# Patient Record
Sex: Female | Born: 1970 | Race: White | Hispanic: No | Marital: Married | Smoking: Current every day smoker
Health system: Southern US, Community
[De-identification: ages and names within clinical notes are randomized; demographics above are authoritative.]

## PROBLEM LIST (undated history)

## (undated) DIAGNOSIS — D0511 Intraductal carcinoma in situ of right breast: Secondary | ICD-10-CM

## (undated) DIAGNOSIS — D649 Anemia, unspecified: Secondary | ICD-10-CM

## (undated) DIAGNOSIS — I10 Essential (primary) hypertension: Secondary | ICD-10-CM

## (undated) DIAGNOSIS — G4733 Obstructive sleep apnea (adult) (pediatric): Secondary | ICD-10-CM

## (undated) DIAGNOSIS — K219 Gastro-esophageal reflux disease without esophagitis: Secondary | ICD-10-CM

## (undated) DIAGNOSIS — Z923 Personal history of irradiation: Secondary | ICD-10-CM

## (undated) DIAGNOSIS — Z8489 Family history of other specified conditions: Secondary | ICD-10-CM

## (undated) DIAGNOSIS — F40240 Claustrophobia: Secondary | ICD-10-CM

## (undated) DIAGNOSIS — G43909 Migraine, unspecified, not intractable, without status migrainosus: Secondary | ICD-10-CM

## (undated) DIAGNOSIS — M1990 Unspecified osteoarthritis, unspecified site: Secondary | ICD-10-CM

## (undated) DIAGNOSIS — C50919 Malignant neoplasm of unspecified site of unspecified female breast: Secondary | ICD-10-CM

## (undated) DIAGNOSIS — D7589 Other specified diseases of blood and blood-forming organs: Secondary | ICD-10-CM

## (undated) DIAGNOSIS — R42 Dizziness and giddiness: Secondary | ICD-10-CM

## (undated) DIAGNOSIS — F419 Anxiety disorder, unspecified: Secondary | ICD-10-CM

## (undated) DIAGNOSIS — R030 Elevated blood-pressure reading, without diagnosis of hypertension: Secondary | ICD-10-CM

## (undated) HISTORY — DX: Other specified diseases of blood and blood-forming organs: D75.89

## (undated) HISTORY — PX: WISDOM TOOTH EXTRACTION: SHX21

## (undated) HISTORY — DX: Malignant neoplasm of unspecified site of unspecified female breast: C50.919

---

## 1991-02-02 HISTORY — PX: TUBAL LIGATION: SHX77

## 2008-11-26 ENCOUNTER — Emergency Department: Payer: Self-pay | Admitting: Emergency Medicine

## 2015-10-24 ENCOUNTER — Emergency Department
Admission: EM | Admit: 2015-10-24 | Discharge: 2015-10-24 | Disposition: A | Discharge status: Home / Self Care | Payer: Managed Care, Other (non HMO) | Attending: Emergency Medicine | Admitting: Emergency Medicine

## 2015-10-24 ENCOUNTER — Encounter: Payer: Self-pay | Admitting: Emergency Medicine

## 2015-10-24 ENCOUNTER — Emergency Department: Payer: Managed Care, Other (non HMO)

## 2015-10-24 DIAGNOSIS — G43909 Migraine, unspecified, not intractable, without status migrainosus: Secondary | ICD-10-CM | POA: Diagnosis not present

## 2015-10-24 DIAGNOSIS — F17210 Nicotine dependence, cigarettes, uncomplicated: Secondary | ICD-10-CM | POA: Insufficient documentation

## 2015-10-24 DIAGNOSIS — R51 Headache: Secondary | ICD-10-CM | POA: Diagnosis present

## 2015-10-24 DIAGNOSIS — G43009 Migraine without aura, not intractable, without status migrainosus: Secondary | ICD-10-CM

## 2015-10-24 HISTORY — DX: Migraine, unspecified, not intractable, without status migrainosus: G43.909

## 2015-10-24 LAB — CBC WITH DIFFERENTIAL/PLATELET
Basophils Absolute: 0.1 10*3/uL (ref 0–0.1)
Basophils Relative: 1 %
EOS ABS: 0.2 10*3/uL (ref 0–0.7)
EOS PCT: 2 %
HCT: 45.2 % (ref 35.0–47.0)
HEMOGLOBIN: 16 g/dL (ref 12.0–16.0)
LYMPHS ABS: 2 10*3/uL (ref 1.0–3.6)
LYMPHS PCT: 22 %
MCH: 35.1 pg — AB (ref 26.0–34.0)
MCHC: 35.5 g/dL (ref 32.0–36.0)
MCV: 99 fL (ref 80.0–100.0)
MONOS PCT: 4 %
Monocytes Absolute: 0.4 10*3/uL (ref 0.2–0.9)
Neutro Abs: 6.6 10*3/uL — ABNORMAL HIGH (ref 1.4–6.5)
Neutrophils Relative %: 71 %
PLATELETS: 294 10*3/uL (ref 150–440)
RBC: 4.57 MIL/uL (ref 3.80–5.20)
RDW: 12.7 % (ref 11.5–14.5)
WBC: 9.3 10*3/uL (ref 3.6–11.0)

## 2015-10-24 LAB — BASIC METABOLIC PANEL
Anion gap: 9 (ref 5–15)
BUN: 10 mg/dL (ref 6–20)
CHLORIDE: 105 mmol/L (ref 101–111)
CO2: 22 mmol/L (ref 22–32)
CREATININE: 0.81 mg/dL (ref 0.44–1.00)
Calcium: 9.6 mg/dL (ref 8.9–10.3)
GFR calc Af Amer: >60 mL/min (ref >60)
GFR calc non Af Amer: >60 mL/min (ref >60)
GLUCOSE: 132 mg/dL — AB (ref 65–99)
POTASSIUM: 3.7 mmol/L (ref 3.5–5.1)
Sodium: 136 mmol/L (ref 135–145)

## 2015-10-24 MED ORDER — SODIUM CHLORIDE 0.9 % IV BOLUS (SEPSIS)
1000.0000 mL | Freq: Once | INTRAVENOUS | Status: AC
  Administered 2015-10-24: 1000 mL via INTRAVENOUS

## 2015-10-24 MED ORDER — KETOROLAC TROMETHAMINE 30 MG/ML IJ SOLN
30.0000 mg | Freq: Once | INTRAMUSCULAR | Status: AC
  Administered 2015-10-24: 30 mg via INTRAVENOUS
  Filled 2015-10-24: qty 1

## 2015-10-24 MED ORDER — METOCLOPRAMIDE HCL 5 MG/ML IJ SOLN
10.0000 mg | Freq: Once | INTRAMUSCULAR | Status: AC
  Administered 2015-10-24: 10 mg via INTRAVENOUS
  Filled 2015-10-24: qty 2

## 2015-10-24 MED ORDER — DIPHENHYDRAMINE HCL 50 MG/ML IJ SOLN
12.5000 mg | Freq: Once | INTRAMUSCULAR | Status: AC
  Administered 2015-10-24: 12.5 mg via INTRAVENOUS
  Filled 2015-10-24: qty 1

## 2015-10-24 NOTE — ED Triage Notes (Signed)
Pt in via triage with complaints of waking up with a headache this morning, taking 500mg  ibuprofen with out any relief.  Headache has since become worse, causing light sensitivity and blurred vision to right eye.  Pt with hx of migraines.  Pt A/Ox4, vitals WDL, no immediate distress at this time.

## 2015-10-24 NOTE — ED Notes (Signed)
Pt states that she woke up at 0700 normal, states her head started hurting at 0900, states that she gets headaches all the time but this one wouldn't go away, no deficits noted, cont to monitor

## 2015-10-24 NOTE — ED Provider Notes (Signed)
[facility] Provider Note   CSN: [id number] Arrival date & time: 10/24/15  1210     History   Chief Complaint Chief Complaint  Patient presents with  . Headache    HPI Wendy Russell is a 45 y.o. female history of migraines here presenting with Headaches. She states that she has a history of migraines but this is worse than usual. She woke up feeling okay and shortly afterwards she developed right sided headaches which is typical of her migraines. She took 200 mg of Motrin but it did not help her. Associated with some photophobia and phonophobia. She had an episode of vomiting as well. Denies any fevers or neck pain or stiffness. Denies any history of aneurysms.     The history is provided by the patient.    Past Medical History:  Diagnosis Date  . Migraines     There are no active problems to display for this patient.   Past Surgical History:  Procedure Laterality Date  . CESAREAN SECTION    . TUBAL LIGATION  1993    OB History    No data available       Home Medications    Prior to Admission medications   Not on File    Family History No family history on file.  Social History Social History  Substance Use Topics  . Smoking status: Current Every Day Smoker    Packs/day: 0.50    Types: Cigarettes  . Smokeless tobacco: Never Used  . Alcohol use Yes     Comment: occassional     Allergies   Codeine and Aspirin   Review of Systems Review of Systems  Neurological: Positive for headaches.  All other systems reviewed and are negative.    Physical Exam Updated Vital Signs BP (!) 143/66 (BP Location: Right Arm)   Pulse 78   Temp 98 F (36.7 C) (Oral)   Resp 16   Ht 4\' 11"  (1.499 m)   Wt 148 lb (67.1 kg)   LMP 10/05/2015 (Exact Date)   SpO2 98%   BMI 29.89 kg/m   Physical Exam  Constitutional: She is oriented to person, place, and time.  Uncomfortable   HENT:  Head: Normocephalic.  Eyes: EOM are normal. Pupils are  equal, round, and reactive to light.  Neck: Normal range of motion. Neck supple.  Cardiovascular: Normal rate, regular rhythm and normal heart sounds.   Pulmonary/Chest: Effort normal and breath sounds normal. No respiratory distress. She has no wheezes. She has no rales.  Abdominal: Soft. Bowel sounds are normal. She exhibits no distension. There is no tenderness. There is no guarding.  Musculoskeletal: Normal range of motion.  Neurological: She is alert and oriented to person, place, and time. No cranial nerve deficit. Coordination normal.  CN 2-12 intact. Nl strength throughout. Nl sensation throughout   Skin: Skin is warm.  Psychiatric: She has a normal mood and affect.  Nursing note and vitals reviewed.    ED Treatments / Results  Labs (all labs ordered are listed, but only abnormal results are displayed) Labs Reviewed  CBC WITH DIFFERENTIAL/PLATELET  BASIC METABOLIC PANEL    EKG  EKG Interpretation None       Radiology Ct Head Wo Contrast  Result Date: 10/24/2015 CLINICAL DATA:  Headache starting this morning, migraines EXAM: CT HEAD WITHOUT CONTRAST TECHNIQUE: Contiguous axial images were obtained from the base of the skull through the vertex without intravenous contrast. COMPARISON:  None. FINDINGS: Brain: No intracranial hemorrhage, mass  effect or midline shift. No acute cortical infarction. No mass lesion is noted on this unenhanced scan. No hydrocephalus. The gray and white-matter differentiation is preserved. Vascular: No hyperdense vessel or unexpected calcification. Skull: Normal. Negative for fracture or focal lesion. Sinuses/Orbits: No acute finding. Other: None. IMPRESSION: No acute intracranial abnormality. No definite acute cortical infarction. Electronically Signed   By: Lahoma Crocker M.D.   On: 10/24/2015 13:38    Procedures Procedures (including critical care time)  Medications Ordered in ED Medications  sodium chloride 0.9 % bolus 1,000 mL (not  administered)  ketorolac (TORADOL) 30 MG/ML injection 30 mg (not administered)  metoCLOPramide (REGLAN) injection 10 mg (not administered)  diphenhydrAMINE (BENADRYL) injection 12.5 mg (not administered)     Initial Impression / Assessment and Plan / ED Course  I have reviewed the triage vital signs and the nursing notes.  Pertinent labs & imaging results that were available during my care of the patient were reviewed by me and considered in my medical decision making (see chart for details).  Clinical Course    Wendy Russell is a 45 y.o. female here with headache. Similar to previous migraines. CT head ordered in triage and showed no bleed and symptoms within 4 hrs so will not need LP. Likely migraines. Will give migraine cocktail.  3:19 PM Given migraine cocktail and felt better. Nl neuro exam. Will dc home.    Final Clinical Impressions(s) / ED Diagnoses   Final diagnoses:  None    New Prescriptions New Prescriptions   No medications on file     Drenda Freeze, MD 10/24/15 1520

## 2015-10-24 NOTE — Discharge Instructions (Signed)
Take tylenol, motrin for headaches.   See your doctor  Return to ER if you have worse headaches, vomiting, fevers, neck pain

## 2015-12-05 ENCOUNTER — Other Ambulatory Visit: Payer: Self-pay | Admitting: Internal Medicine

## 2015-12-05 DIAGNOSIS — Z1231 Encounter for screening mammogram for malignant neoplasm of breast: Secondary | ICD-10-CM

## 2015-12-05 DIAGNOSIS — F411 Generalized anxiety disorder: Secondary | ICD-10-CM | POA: Insufficient documentation

## 2015-12-08 DIAGNOSIS — G43009 Migraine without aura, not intractable, without status migrainosus: Secondary | ICD-10-CM | POA: Insufficient documentation

## 2016-01-08 ENCOUNTER — Ambulatory Visit
Admission: RE | Admit: 2016-01-08 | Discharge: 2016-01-08 | Disposition: A | Discharge status: Home / Self Care | Payer: Managed Care, Other (non HMO) | Source: Ambulatory Visit | Attending: Internal Medicine | Admitting: Internal Medicine

## 2016-01-08 DIAGNOSIS — Z1231 Encounter for screening mammogram for malignant neoplasm of breast: Secondary | ICD-10-CM | POA: Diagnosis present

## 2016-09-20 ENCOUNTER — Emergency Department
Admission: EM | Admit: 2016-09-20 | Discharge: 2016-09-20 | Disposition: A | Discharge status: Home / Self Care | Payer: Commercial Managed Care - PPO | Attending: Student in an Organized Health Care Education/Training Program | Admitting: Student in an Organized Health Care Education/Training Program

## 2016-09-20 ENCOUNTER — Emergency Department: Payer: Commercial Managed Care - PPO

## 2016-09-20 ENCOUNTER — Encounter: Payer: Self-pay | Admitting: Emergency Medicine

## 2016-09-20 DIAGNOSIS — H8149 Vertigo of central origin, unspecified ear: Secondary | ICD-10-CM | POA: Diagnosis not present

## 2016-09-20 DIAGNOSIS — H538 Other visual disturbances: Secondary | ICD-10-CM

## 2016-09-20 DIAGNOSIS — Z79899 Other long term (current) drug therapy: Secondary | ICD-10-CM | POA: Diagnosis not present

## 2016-09-20 DIAGNOSIS — F17210 Nicotine dependence, cigarettes, uncomplicated: Secondary | ICD-10-CM | POA: Diagnosis not present

## 2016-09-20 DIAGNOSIS — H53149 Visual discomfort, unspecified: Secondary | ICD-10-CM | POA: Diagnosis not present

## 2016-09-20 DIAGNOSIS — R42 Dizziness and giddiness: Secondary | ICD-10-CM

## 2016-09-20 LAB — TROPONIN I: Troponin I: <0.03 ng/mL (ref <0.03)

## 2016-09-20 LAB — CBC
HCT: 42.2 % (ref 35.0–47.0)
Hemoglobin: 15 g/dL (ref 12.0–16.0)
MCH: 35.6 pg — AB (ref 26.0–34.0)
MCHC: 35.5 g/dL (ref 32.0–36.0)
MCV: 100.3 fL — AB (ref 80.0–100.0)
PLATELETS: 286 10*3/uL (ref 150–440)
RBC: 4.21 MIL/uL (ref 3.80–5.20)
RDW: 13.7 % (ref 11.5–14.5)
WBC: 9.5 10*3/uL (ref 3.6–11.0)

## 2016-09-20 LAB — POCT PREGNANCY, URINE: PREG TEST UR: NEGATIVE

## 2016-09-20 LAB — URINALYSIS, COMPLETE (UACMP) WITH MICROSCOPIC
Bacteria, UA: NONE SEEN
Bilirubin Urine: NEGATIVE
Glucose, UA: NEGATIVE mg/dL
Ketones, ur: NEGATIVE mg/dL
Leukocytes, UA: NEGATIVE
Nitrite: NEGATIVE
PH: 5 (ref 5.0–8.0)
Protein, ur: NEGATIVE mg/dL
Specific Gravity, Urine: 1.016 (ref 1.005–1.030)

## 2016-09-20 LAB — BASIC METABOLIC PANEL
Anion gap: 8 (ref 5–15)
BUN: 9 mg/dL (ref 6–20)
CALCIUM: 9.2 mg/dL (ref 8.9–10.3)
CHLORIDE: 106 mmol/L (ref 101–111)
CO2: 22 mmol/L (ref 22–32)
CREATININE: 0.71 mg/dL (ref 0.44–1.00)
GFR calc Af Amer: >60 mL/min (ref >60)
GFR calc non Af Amer: >60 mL/min (ref >60)
GLUCOSE: 130 mg/dL — AB (ref 65–99)
Potassium: 3.6 mmol/L (ref 3.5–5.1)
Sodium: 136 mmol/L (ref 135–145)

## 2016-09-20 MED ORDER — PROCHLORPERAZINE EDISYLATE 5 MG/ML IJ SOLN
10.0000 mg | Freq: Once | INTRAMUSCULAR | Status: AC
  Administered 2016-09-20: 10 mg via INTRAVENOUS
  Filled 2016-09-20: qty 2

## 2016-09-20 MED ORDER — SODIUM CHLORIDE 0.9 % IV BOLUS (SEPSIS)
1000.0000 mL | Freq: Once | INTRAVENOUS | Status: AC
  Administered 2016-09-20: 1000 mL via INTRAVENOUS

## 2016-09-20 MED ORDER — DIPHENHYDRAMINE HCL 50 MG/ML IJ SOLN
12.5000 mg | Freq: Once | INTRAMUSCULAR | Status: AC
Start: 2016-09-20 — End: 2016-09-20
  Administered 2016-09-20: 12.5 mg via INTRAVENOUS
  Filled 2016-09-20: qty 1

## 2016-09-20 NOTE — ED Provider Notes (Signed)
[facility] Emergency Department Provider Note    First MD Initiated Contact with Patient 09/20/16 1102     (approximate)  I have reviewed the triage vital signs and the nursing notes.   HISTORY  Chief Complaint Blurred Vision and Dizziness    HPI Wendy Russell is a 46 y.o. female sensitivity complaint of blurry vision and photophobia that started last night. Patient denies any chest pain or shortness of breath. No numbness or tingling. 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 States that this feels similar to her previous aura.  No fevers. No nausea or vomiting. No abdominal pain. No previous history of stroke.   Past Medical History:  Diagnosis Date  . Migraines    Family History  Problem Relation Age of Onset  . Breast cancer Mother 44   Past Surgical History:  Procedure Laterality Date  . CESAREAN SECTION    . TUBAL LIGATION  1993   There are no active problems to display for this patient.     Prior to Admission medications   Medication Sig Start Date End Date Taking? Authorizing Provider  propranolol (INDERAL) 40 MG tablet Take 20 mg by mouth daily. 12/05/15 12/04/16 Yes [provider]  SUMAtriptan (IMITREX) 100 MG tablet Take 50 mg by mouth daily. 12/08/15   [provider]    Allergies Codeine and Aspirin    Social History Social History  Substance Use Topics  . Smoking status: Current Every Day Smoker    Packs/day: 0.50    Types: Cigarettes  . Smokeless tobacco: Never Used  . Alcohol use Yes     Comment: occassional    Review of Systems Patient denies headaches, rhinorrhea, blurry vision, numbness, shortness of breath, chest pain, edema, cough, abdominal pain, nausea, vomiting, diarrhea, dysuria, fevers, rashes or hallucinations unless otherwise stated above in HPI. ____________________________________________   PHYSICAL EXAM:  VITAL SIGNS: Vitals:   09/20/16 1100  09/20/16 1200  BP: 124/67 127/66  Pulse: (!) 59 (!) 57  Resp: (!) 24 19  Temp:    SpO2: 99% 97%    Constitutional: Alert and oriented. Well appearing and in no acute distress. Eyes: Conjunctivae are normal.  Head: Atraumatic. Nose: No congestion/rhinnorhea. Mouth/Throat: Mucous membranes are moist.   Neck: No stridor. Painless ROM.  Cardiovascular: Normal rate, regular rhythm. Grossly normal heart sounds.  Good peripheral circulation. Respiratory: Normal respiratory effort.  No retractions. Lungs CTAB. Gastrointestinal: Soft and nontender. No distention. No abdominal bruits. No CVA tenderness. Genitourinary:  Musculoskeletal: No lower extremity tenderness nor edema.  No joint effusions. Neurologic:  CN- intact.  No facial droop, Normal FNF.  Normal heel to shin.  Sensation intact bilaterally. Normal speech and language. No gross focal neurologic deficits are appreciated. No gait instability. Skin:  Skin is warm, dry and intact. No rash noted. Psychiatric: Mood and affect are normal. Speech and behavior are normal.  ____________________________________________   LABS (all labs ordered are listed, but only abnormal results are displayed)  Results for orders placed or performed during the hospital encounter of 09/20/16 (from the past 24 hour(s))  Basic metabolic panel     Status: Abnormal   Collection Time: 09/20/16  8:17 AM  Result Value Ref Range   Sodium 136 135 - 145 mmol/L   Potassium 3.6 3.5 - 5.1 mmol/L   Chloride 106 101 - 111 mmol/L   CO2 22 22 - 32 mmol/L   Glucose, Bld 130 (H) 65 - 99  mg/dL   BUN 9 6 - 20 mg/dL   Creatinine, Ser 0.71 0.44 - 1.00 mg/dL   Calcium 9.2 8.9 - 10.3 mg/dL   GFR calc non Af Amer >60 >60 mL/min   GFR calc Af Amer >60 >60 mL/min   Anion gap 8 5 - 15  CBC     Status: Abnormal   Collection Time: 09/20/16  8:17 AM  Result Value Ref Range   WBC 9.5 3.6 - 11.0 K/uL   RBC 4.21 3.80 - 5.20 MIL/uL   Hemoglobin 15.0 12.0 - 16.0 g/dL   HCT 42.2  35.0 - 47.0 %   MCV 100.3 (H) 80.0 - 100.0 fL   MCH 35.6 (H) 26.0 - 34.0 pg   MCHC 35.5 32.0 - 36.0 g/dL   RDW 13.7 11.5 - 14.5 %   Platelets 286 150 - 440 K/uL  Urinalysis, Complete w Microscopic     Status: Abnormal   Collection Time: 09/20/16  8:17 AM  Result Value Ref Range   Color, Urine YELLOW (A) YELLOW   APPearance CLEAR (A) CLEAR   Specific Gravity, Urine 1.016 1.005 - 1.030   pH 5.0 5.0 - 8.0   Glucose, UA NEGATIVE NEGATIVE mg/dL   Hgb urine dipstick MODERATE (A) NEGATIVE   Bilirubin Urine NEGATIVE NEGATIVE   Ketones, ur NEGATIVE NEGATIVE mg/dL   Protein, ur NEGATIVE NEGATIVE mg/dL   Nitrite NEGATIVE NEGATIVE   Leukocytes, UA NEGATIVE NEGATIVE   RBC / HPF 6-30 0 - 5 RBC/hpf   WBC, UA 0-5 0 - 5 WBC/hpf   Bacteria, UA NONE SEEN NONE SEEN   Squamous Epithelial / LPF 0-5 (A) NONE SEEN   Mucus PRESENT   Troponin I     Status: None   Collection Time: 09/20/16  8:17 AM  Result Value Ref Range   Troponin I <0.03 <0.03 ng/mL  Pregnancy, urine POC     Status: None   Collection Time: 09/20/16  8:34 AM  Result Value Ref Range   Preg Test, Ur NEGATIVE NEGATIVE   ____________________________________________  EKG My review and personal interpretation at Time: 8:28   Indication: blurry vision  Rate: 66  Rhythm: sinus Axis: normal Other: normal intervals, no stemi,  ____________________________________________  RADIOLOGY  I personally reviewed all radiographic images ordered to evaluate for the above acute complaints and reviewed radiology reports and findings.  These findings were personally discussed with the patient.  Please see medical record for radiology report.  ____________________________________________   PROCEDURES  Procedure(s) performed:  Procedures    Critical Care performed: no ____________________________________________   INITIAL IMPRESSION / ASSESSMENT AND PLAN / ED COURSE  Pertinent labs & imaging results that were available during my care of  the patient were reviewed by me and considered in my medical decision making (see chart for details).  DDX: cva, tia, hypoglycemia, migraine, dehydration, electrolyte abnormality, dissection, sepsis   Wendy Russell is a 46 y.o. who presents to the ED with Symptoms as described above. Patient well-appearing and in no acute distress. Neuro exam is nonfocal. Does have some mild blurry vision but no visual field cuts. CT imaging ordered to evaluate for the above differential shows no acute abnormality. Blood work as well as otherwise reassuring. Patient given IV fluids as well as migraine cocktail to treat for possible complex migraine with component of aura.    Patient reassessed with improvement in symptoms after IV fluids and migraine cocktail. CT head normal. No persistent neuro deficit. At this point do feel  that she is appropriate for follow-up with her PCP with referral to neurology.  Patient was able to tolerate PO and was able to ambulate with a steady gait.  Have discussed with the patient and available family all diagnostics and treatments performed thus far and all questions were answered to the best of my ability. The patient demonstrates understanding and agreement with plan.   ____________________________________________   FINAL CLINICAL IMPRESSION(S) / ED DIAGNOSES  Final diagnoses:  Lightheadedness  Blurry vision, bilateral      NEW MEDICATIONS STARTED DURING THIS VISIT:  Discharge Medication List as of 09/20/2016 12:10 PM       Note:  This document was prepared using Dragon voice recognition software and may include unintentional dictation errors.    Merlyn Lot, MD 09/20/16 2120

## 2016-09-20 NOTE — Discharge Instructions (Signed)

## 2016-09-20 NOTE — ED Notes (Signed)
Pt unhooked to use bathroom and then assisted back to bed, resting in bed on cell phone in no distress

## 2016-09-20 NOTE — ED Triage Notes (Signed)
Pt c/o blurred vision to both eyes and feeling lightheaded since last night.  Felt like going to pass out last night.  NAD currently.  Respirations unlabored.  Denies pain or headaches.  C/o feeling dizzy.

## 2017-04-15 ENCOUNTER — Encounter: Payer: Self-pay | Admitting: Emergency Medicine

## 2017-04-15 DIAGNOSIS — N938 Other specified abnormal uterine and vaginal bleeding: Secondary | ICD-10-CM | POA: Insufficient documentation

## 2017-04-15 DIAGNOSIS — Z79899 Other long term (current) drug therapy: Secondary | ICD-10-CM | POA: Diagnosis not present

## 2017-04-15 DIAGNOSIS — F17210 Nicotine dependence, cigarettes, uncomplicated: Secondary | ICD-10-CM | POA: Diagnosis not present

## 2017-04-15 LAB — POCT PREGNANCY, URINE: Preg Test, Ur: NEGATIVE

## 2017-04-15 NOTE — ED Triage Notes (Signed)
Pt comes into the ED via POV c/o vaginal bleeding that has been intermittent for a couple of weeks.  Patient knows she is premenopausal and has tried to contact OB clinics to been stablished.  They informed her they couldn't get her in for 4-6 weeks and that she needed an ultrasound.  Patient in NAD at this time.

## 2017-04-16 ENCOUNTER — Emergency Department
Admission: EM | Admit: 2017-04-16 | Discharge: 2017-04-16 | Disposition: A | Discharge status: Home / Self Care | Payer: Commercial Managed Care - PPO | Attending: Emergency Medicine | Admitting: Emergency Medicine

## 2017-04-16 ENCOUNTER — Emergency Department: Payer: Commercial Managed Care - PPO

## 2017-04-16 DIAGNOSIS — N939 Abnormal uterine and vaginal bleeding, unspecified: Secondary | ICD-10-CM

## 2017-04-16 LAB — WET PREP, GENITAL
Clue Cells Wet Prep HPF POC: NONE SEEN
SPERM: NONE SEEN
TRICH WET PREP: NONE SEEN
Yeast Wet Prep HPF POC: NONE SEEN

## 2017-04-16 LAB — CHLAMYDIA/NGC RT PCR (ARMC ONLY)
Chlamydia Tr: NOT DETECTED
N gonorrhoeae: NOT DETECTED

## 2017-04-16 LAB — CBC WITH DIFFERENTIAL/PLATELET
BASOS PCT: 1 %
Basophils Absolute: 0.1 10*3/uL (ref 0–0.1)
Eosinophils Absolute: 0.2 10*3/uL (ref 0–0.7)
Eosinophils Relative: 3 %
HEMATOCRIT: 44 % (ref 35.0–47.0)
Hemoglobin: 15.6 g/dL (ref 12.0–16.0)
Lymphocytes Relative: 36 %
Lymphs Abs: 3.3 10*3/uL (ref 1.0–3.6)
MCH: 35.2 pg — ABNORMAL HIGH (ref 26.0–34.0)
MCHC: 35.6 g/dL (ref 32.0–36.0)
MCV: 99.1 fL (ref 80.0–100.0)
MONO ABS: 0.6 10*3/uL (ref 0.2–0.9)
MONOS PCT: 6 %
NEUTROS ABS: 4.8 10*3/uL (ref 1.4–6.5)
Neutrophils Relative %: 54 %
Platelets: 320 10*3/uL (ref 150–440)
RBC: 4.44 MIL/uL (ref 3.80–5.20)
RDW: 13.3 % (ref 11.5–14.5)
WBC: 9.1 10*3/uL (ref 3.6–11.0)

## 2017-04-16 NOTE — Discharge Instructions (Signed)
Return to the emergency department for new, worsening, or persistent severe bleeding, weakness or lightheadedness, worsening abdominal or pelvic pain, or any other new or worsening symptoms that concern you.  Follow-up with an OB/GYN of your choice at the next available appointment.

## 2017-04-16 NOTE — ED Notes (Signed)
Patient transported to Ultrasound 

## 2017-04-16 NOTE — ED Provider Notes (Signed)
[facility] Emergency Department Provider Note ____________________________________________   First MD Initiated Contact with Patient 04/16/17 [phone]     (approximate)  I have reviewed the triage vital signs and the nursing notes.   HISTORY  Chief Complaint Vaginal Bleeding    HPI Wendy Russell is a 47 y.o. female with past medical history as noted below who presents with vaginal bleeding for approximately 20 days, described as relatively mild spotting but continuous, associated with occasional left-sided pelvic cramping.  Patient denies associated dizziness, lightheadedness, weakness, difficulty breathing, or any vomiting or diarrhea.  No urinary symptoms.  The patient states she does have some cancer history in her family and is somewhat worried that this could be the cause.  The patient states that she tried to get outpatient OB/GYN appointment but was told she would have to wait 4-6 weeks so decided to come to the ER first.   Past Medical History:  Diagnosis Date  . Migraines     There are no active problems to display for this patient.   Past Surgical History:  Procedure Laterality Date  . CESAREAN SECTION    . TUBAL LIGATION  1993    Prior to Admission medications   Medication Sig Start Date End Date Taking? Authorizing Provider  propranolol (INDERAL) 40 MG tablet Take 20 mg by mouth daily. 12/05/15 12/04/16  [provider]  SUMAtriptan (IMITREX) 100 MG tablet Take 50 mg by mouth daily. 12/08/15   [provider]    Allergies Codeine; Aspirin; and Doxycycline  Family History  Problem Relation Age of Onset  . Breast cancer Mother 61    Social History Social History   Tobacco Use  . Smoking status: Current Every Day Smoker    Packs/day: 0.50    Types: Cigarettes  . Smokeless tobacco: Never Used  Substance Use Topics  . Alcohol use: Yes    Comment: occassional  . Drug use: No    Review of  Systems  Constitutional: No fever. Eyes: No redness. ENT: No sore throat. Cardiovascular: Denies chest pain. Respiratory: Denies shortness of breath. Gastrointestinal: No vomiting. Genitourinary: Negative for dysuria or vaginal discharge.  Musculoskeletal: Negative for back pain. Skin: Negative for rash. Neurological: Negative for headache.   ____________________________________________   PHYSICAL EXAM:  VITAL SIGNS: ED Triage Vitals  Enc Vitals Group     BP 04/15/17 2301 (!) 161/82     Pulse Rate 04/15/17 2301 83     Resp 04/15/17 2301 18     Temp 04/15/17 2301 98.2 F (36.8 C)     Temp Source 04/15/17 2301 Oral     SpO2 04/15/17 2301 99 %     Weight 04/15/17 2302 155 lb (70.3 kg)     Height 04/15/17 2302 4\' 11"  (1.499 m)     Head Circumference --      Peak Flow --      Pain Score --      Pain Loc --      Pain Edu? --      Excl. in [place]? --     Constitutional: Alert and oriented. Well appearing and in no acute distress. Eyes: Conjunctivae are normal.  Head: Atraumatic. Nose: No congestion/rhinnorhea. Mouth/Throat: Mucous membranes are moist.   Neck: Normal range of motion.  Cardiovascular:  Good peripheral circulation. Respiratory: Normal respiratory effort.  . Gastrointestinal: Soft with mild left suprapubic tenderness. No distention.  Genitourinary: Normal external genitalia.  Small amount of blood in the vaginal vault with  no active hemorrhage.  No significant CMT or adnexal tenderness. Musculoskeletal: No lower extremity edema.  Extremities warm and well perfused.  Neurologic:  Normal speech and language. No gross focal neurologic deficits are appreciated.  Skin:  Skin is warm and dry. No rash noted. Psychiatric: Mood and affect are normal. Speech and behavior are normal.  ____________________________________________   LABS (all labs ordered are listed, but only abnormal results are displayed)  Labs Reviewed  WET PREP, GENITAL - Abnormal; Notable for the  following components:      Result Value   WBC, Wet Prep HPF POC MODERATE (*)    All other components within normal limits  CBC WITH DIFFERENTIAL/PLATELET - Abnormal; Notable for the following components:   MCH 35.2 (*)    All other components within normal limits  CHLAMYDIA/NGC RT PCR ([facility] ONLY)  POC URINE PREG, ED  POCT PREGNANCY, URINE   ____________________________________________  EKG   ____________________________________________  RADIOLOGY  US pelvis: 3 cm ovarian cyst, small amount of fluid in cervical canal  ____________________________________________   PROCEDURES  Procedure(s) performed: No  Procedures  Critical Care performed: No ____________________________________________   INITIAL IMPRESSION / ASSESSMENT AND PLAN / ED COURSE  Pertinent labs & imaging results that were available during my care of the patient were reviewed by me and considered in my medical decision making (see chart for details).  47 year old female with past medical history as noted above presents with subacute course of vaginal bleeding with some mild pain.  Patient is likely perimenopausal.  She states her periods have always been somewhat irregular but her last normal period was last month.  On exam, vital signs are normal except for hypertension, the patient is well-appearing, the pelvic exam is as described above.  Differential includes normal menopause, DUB, fibroids, endometriosis, versus less likely hyperplasia or malignancy.  Plan: CBC, cervical swab, and pelvic ultrasound.    ----------------------------------------- 6:19 AM on 04/16/2017 -----------------------------------------  Ultrasound reveals no concerning acute findings.  Patient's wet prep and CBC are within normal limits.  GC/CT is still pending however patient has no risk factors for STI and no discharge or other findings of these infections on exam.  I counseled the patient on the possible causes of her  symptoms, and on the follow-up plan.  Patient will follow up with an OB/GYN in [place] in several weeks.  Return precautions given, and the patient expresses understanding.  ____________________________________________   FINAL CLINICAL IMPRESSION(S) / ED DIAGNOSES  Final diagnoses:  Vaginal bleeding  Abnormal vaginal bleeding      NEW MEDICATIONS STARTED DURING THIS VISIT:  New Prescriptions   No medications on file     Note:  This document was prepared using Dragon voice recognition software and may include unintentional dictation errors.    Arta Silence, MD 04/16/17 [phone]

## 2017-04-25 ENCOUNTER — Other Ambulatory Visit: Payer: Self-pay | Admitting: Internal Medicine

## 2017-06-14 ENCOUNTER — Other Ambulatory Visit: Payer: Self-pay | Admitting: Internal Medicine

## 2017-06-14 ENCOUNTER — Other Ambulatory Visit: Payer: Self-pay | Admitting: Obstetrics and Gynecology

## 2017-06-14 DIAGNOSIS — Z1231 Encounter for screening mammogram for malignant neoplasm of breast: Secondary | ICD-10-CM

## 2017-06-17 DIAGNOSIS — Z0000 Encounter for general adult medical examination without abnormal findings: Secondary | ICD-10-CM | POA: Insufficient documentation

## 2017-06-20 ENCOUNTER — Ambulatory Visit
Admission: RE | Admit: 2017-06-20 | Discharge: 2017-06-20 | Disposition: A | Discharge status: Home / Self Care | Payer: Commercial Managed Care - PPO | Source: Ambulatory Visit | Attending: Obstetrics and Gynecology | Admitting: Obstetrics and Gynecology

## 2017-06-20 DIAGNOSIS — Z1231 Encounter for screening mammogram for malignant neoplasm of breast: Secondary | ICD-10-CM | POA: Diagnosis present

## 2017-07-04 NOTE — H&P (Signed)
Wendy Russell is a 47 y.o. female here for Fractional D+C and H/S and Novasure endometrial ablation .Pt with 12 weeks of AUB . Before menses was irregular q 20-40 days .she has not been getting GYn care .  ?last pap .  U/s done in ED stripe 55mm o/w nl .  Pt smokes 1/2 PPD Unable to obtain a EMBX in office . She was treated with provera and continued to bleed .   Past Medical History:  has a past medical history of Abnormal vaginal bleeding, unspecified and Migraine.  Past Surgical History:  has a past surgical history that includes Cesarean section and Tubal ligation. Family History: family history includes Breast cancer in her mother; Diabetes type II in her father; Migraines in her sister; Ovarian cancer in her mother. Social History:  reports that she has been smoking.  She has been smoking about 0.25 packs per day. She has never used smokeless tobacco. She reports that she does not drink alcohol or use drugs. OB/GYN History:          OB History    Gravida  3   Para  2   Term  2   Preterm      AB  1   Living  2     SAB      TAB      Ectopic      Molar      Multiple      Live Births             Allergies: is allergic to codeine; aspirin; and doxycycline. Medications:  Current Outpatient Medications:  .  acetaminophen (TYLENOL) 500 mg capsule, Take 1,000 mg by mouth every 6 (six) hours, Disp: , Rfl:  .  ibuprofen (ADVIL,MOTRIN) 400 MG tablet, Take 400 mg by mouth every 6 (six) hours as needed for Pain, Disp: , Rfl:  .  nortriptyline (PAMELOR) 25 MG capsule, Take 1 capsule (25 mg total) by mouth nightly, Disp: 90 capsule, Rfl: 3 .  SUMAtriptan (IMITREX) 100 MG tablet, Take 1/2 to 1 tablet at headache onset. May take a second dose after 2 hours if needed., Disp: 9 tablet, Rfl: 11  Review of Systems: General:                      No fatigue or weight loss Eyes:                           No vision changes Ears:                            No hearing  difficulty Respiratory:                No cough or shortness of breath Pulmonary:                  No asthma or shortness of breath Cardiovascular:           No chest pain, palpitations, dyspnea on exertion Gastrointestinal:          No abdominal bloating, chronic diarrhea, constipations, masses, pain or hematochezia Genitourinary:             No hematuria, dysuria, abnormal vaginal discharge, pelvic pain, Menometrorrhagia Lymphatic:                   No swollen lymph nodes Musculoskeletal:  No muscle weakness Neurologic:                  No extremity weakness, syncope, seizure disorder Psychiatric:                  No history of depression, delusions or suicidal/homicidal ideation    Exam:      Vitals:   07/01/17 1556  BP: (!) 153/92  Pulse: 75    Body mass index is 32.72 kg/m.  WDWN white/ black female in NAD   Lungs: CTA  CV : RRR without murmur   Breast: exam done in sitting and lying position : No dimpling or retraction, no dominant mass, no spontaneous discharge, no axillary adenopathy Neck:  no thyromegaly Abdomen: soft , no mass, normal active bowel sounds,  non-tender, no rebound tenderness Pelvic: tanner stage 5 ,  External genitalia: vulva /labia no lesions Urethra: no prolapse Vagina: normal physiologic d/c Cervix: no lesions, no cervical motion tenderness   Uterus: normal size shape and contour, non-tender Adnexa: no mass,  non-tender   Rectovaginal:   Impression:   The primary encounter diagnosis was Abnormal uterine bleeding (AUB), unspecified. A diagnosis of Cervical os stenosis was also pertinent to this visit.    Plan:  Fractional dilation and curettage  H/S   Novasure ablation   pt has been counseled regarding the risks of the procedure

## 2017-07-06 ENCOUNTER — Encounter
Admission: RE | Admit: 2017-07-06 | Discharge: 2017-07-06 | Disposition: A | Discharge status: Home / Self Care | Payer: Commercial Managed Care - PPO | Source: Ambulatory Visit | Attending: Obstetrics and Gynecology | Admitting: Obstetrics and Gynecology

## 2017-07-06 ENCOUNTER — Other Ambulatory Visit: Payer: Self-pay

## 2017-07-06 DIAGNOSIS — N920 Excessive and frequent menstruation with regular cycle: Secondary | ICD-10-CM | POA: Diagnosis present

## 2017-07-06 HISTORY — DX: Anxiety disorder, unspecified: F41.9

## 2017-07-06 HISTORY — DX: Essential (primary) hypertension: I10

## 2017-07-06 HISTORY — DX: Anemia, unspecified: D64.9

## 2017-07-06 HISTORY — DX: Gastro-esophageal reflux disease without esophagitis: K21.9

## 2017-07-06 LAB — TYPE AND SCREEN
ABO/RH(D): O POS
Antibody Screen: NEGATIVE

## 2017-07-06 LAB — BASIC METABOLIC PANEL
Anion gap: 10 (ref 5–15)
BUN: 10 mg/dL (ref 6–20)
CHLORIDE: 105 mmol/L (ref 101–111)
CO2: 21 mmol/L — AB (ref 22–32)
CREATININE: 0.62 mg/dL (ref 0.44–1.00)
Calcium: 9.1 mg/dL (ref 8.9–10.3)
GFR calc Af Amer: >60 mL/min (ref >60)
GFR calc non Af Amer: >60 mL/min (ref >60)
GLUCOSE: 133 mg/dL — AB (ref 65–99)
POTASSIUM: 3.2 mmol/L — AB (ref 3.5–5.1)
Sodium: 136 mmol/L (ref 135–145)

## 2017-07-06 LAB — CBC
HEMATOCRIT: 39.2 % (ref 35.0–47.0)
Hemoglobin: 13.7 g/dL (ref 12.0–16.0)
MCH: 35.5 pg — AB (ref 26.0–34.0)
MCHC: 35.1 g/dL (ref 32.0–36.0)
MCV: 101.3 fL — AB (ref 80.0–100.0)
PLATELETS: 343 10*3/uL (ref 150–440)
RBC: 3.87 MIL/uL (ref 3.80–5.20)
RDW: 13.5 % (ref 11.5–14.5)
WBC: 7.9 10*3/uL (ref 3.6–11.0)

## 2017-07-06 NOTE — Patient Instructions (Signed)
Your procedure is scheduled on: 07-08-17 FRIDAY Report to Same Day Surgery 2nd floor medical mall [facility] Entrance-take elevator on left to 2nd floor.  Check in with surgery information desk.) To find out your arrival time please call [phone] between 1PM - 3PM on 07-07-17 THURSDAY  Remember: Instructions that are not followed completely may result in serious medical risk, up to and including death, or upon the discretion of your surgeon and anesthesiologist your surgery may need to be rescheduled.    _x___ 1. Do not eat food after midnight the night before your procedure. NO GUM OR CANDY AFTER MIDNIGHT.  You may drink clear liquids up to 2 hours before you are scheduled to arrive at the hospital for your procedure.  Do not drink clear liquids within 2 hours of your scheduled arrival to the hospital.  Clear liquids include  --Water or Apple juice without pulp  --Clear carbohydrate beverage such as ClearFast or Gatorade  --Black Coffee or Clear Tea (No milk, no creamers, do not add anything to the coffee or Tea     __x__ 2. No Alcohol for 24 hours before or after surgery.   __x__3. No Smoking or e-cigarettes for 24 prior to surgery.  Do not use any chewable tobacco products for at least 6 hour prior to surgery   ____  4. Bring all medications with you on the day of surgery if instructed.    __x__ 5. Notify your doctor if there is any change in your medical condition     (cold, fever, infections).    x___6. On the morning of surgery brush your teeth with toothpaste and water.  You may rinse your mouth with mouth wash if you wish.  Do not swallow any toothpaste or mouthwash.   Do not wear jewelry, make-up, hairpins, clips or nail polish.  Do not wear lotions, powders, or perfumes. You may wear deodorant.  Do not shave 48 hours prior to surgery. Men may shave face and neck.  Do not bring valuables to the hospital.    [facility] is not responsible for any belongings or  valuables.               Contacts, dentures or bridgework may not be worn into surgery.  Leave your suitcase in the car. After surgery it may be brought to your room.  For patients admitted to the hospital, discharge time is determined by your  treatment team.  _  Patients discharged the day of surgery will not be allowed to drive home.  You will need someone to drive you home and stay with you the night of your procedure.    Please read over the following fact sheets that you were given:   [facility] Preparing for Surgery and or MRSA Information   ____ Take anti-hypertensive listed below, cardiac, seizure, asthma, anti-reflux and psychiatric medicines. These include:  1. NONE  2.  3.  4.  5.  6.  ____Fleets enema or Magnesium Citrate as directed.   ____ Use CHG Soap or sage wipes as directed on instruction sheet   ____ Use inhalers on the day of surgery and bring to hospital day of surgery  ____ Stop Metformin and Janumet 2 days prior to surgery.    ____ Take 1/2 of usual insulin dose the night before surgery and none on the morning surgery.   ____ Follow recommendations from Cardiologist, Pulmonologist or PCP regarding stopping Aspirin, Coumadin, Plavix ,Eliquis, Effient, or Pradaxa, and Pletal.  X____Stop Anti-inflammatories such as Advil, Aleve, Ibuprofen, Motrin, Naproxen, Naprosyn, Goodies powders or aspirin products NOW-OK to take Tylenol    ____ Stop supplements until after surgery.     ____ Bring C-Pap to the hospital.

## 2017-07-07 ENCOUNTER — Encounter: Payer: Self-pay | Admitting: *Deleted

## 2017-07-08 ENCOUNTER — Encounter: Payer: Self-pay | Admitting: *Deleted

## 2017-07-08 ENCOUNTER — Encounter
Admission: RE | Disposition: A | Discharge status: Home / Self Care | Payer: Self-pay | Source: Ambulatory Visit | Attending: Obstetrics and Gynecology

## 2017-07-08 ENCOUNTER — Other Ambulatory Visit: Payer: Self-pay

## 2017-07-08 ENCOUNTER — Ambulatory Visit: Payer: Commercial Managed Care - PPO | Admitting: Anesthesiology

## 2017-07-08 ENCOUNTER — Ambulatory Visit
Admission: RE | Admit: 2017-07-08 | Discharge: 2017-07-08 | Disposition: A | Discharge status: Home / Self Care | Payer: Commercial Managed Care - PPO | Source: Ambulatory Visit | Attending: Obstetrics and Gynecology | Admitting: Obstetrics and Gynecology

## 2017-07-08 DIAGNOSIS — N920 Excessive and frequent menstruation with regular cycle: Secondary | ICD-10-CM | POA: Diagnosis not present

## 2017-07-08 HISTORY — PX: DILITATION & CURRETTAGE/HYSTROSCOPY WITH NOVASURE ABLATION: SHX5568

## 2017-07-08 LAB — POCT PREGNANCY, URINE: Preg Test, Ur: NEGATIVE

## 2017-07-08 LAB — POCT I-STAT 4, (NA,K, GLUC, HGB,HCT)
GLUCOSE: 88 mg/dL (ref 65–99)
HEMATOCRIT: 37 % (ref 36.0–46.0)
HEMOGLOBIN: 12.6 g/dL (ref 12.0–15.0)
POTASSIUM: 4 mmol/L (ref 3.5–5.1)
Sodium: 137 mmol/L (ref 135–145)

## 2017-07-08 LAB — ABO/RH: ABO/RH(D): O POS

## 2017-07-08 SURGERY — DILATATION & CURETTAGE/HYSTEROSCOPY WITH NOVASURE ABLATION
Anesthesia: General | Wound class: Clean Contaminated

## 2017-07-08 MED ORDER — LIDOCAINE HCL (CARDIAC) PF 100 MG/5ML IV SOSY
PREFILLED_SYRINGE | INTRAVENOUS | Status: DC | PRN
  Administered 2017-07-08: 100 mg via INTRAVENOUS

## 2017-07-08 MED ORDER — PROPOFOL 10 MG/ML IV BOLUS
INTRAVENOUS | Status: AC
  Filled 2017-07-08: qty 80

## 2017-07-08 MED ORDER — ACETAMINOPHEN 10 MG/ML IV SOLN
INTRAVENOUS | Status: DC | PRN
  Administered 2017-07-08: 1000 mg via INTRAVENOUS

## 2017-07-08 MED ORDER — LIDOCAINE HCL (PF) 2 % IJ SOLN
INTRAMUSCULAR | Status: AC
  Filled 2017-07-08: qty 10

## 2017-07-08 MED ORDER — FAMOTIDINE 20 MG PO TABS
ORAL_TABLET | ORAL | Status: AC
  Filled 2017-07-08: qty 1

## 2017-07-08 MED ORDER — ACETAMINOPHEN 500 MG PO TABS: 1000.0000 mg | ORAL_TABLET | Freq: Four times a day (QID) | ORAL | Status: DC | PRN

## 2017-07-08 MED ORDER — ONDANSETRON HCL 4 MG/2ML IJ SOLN: 4.0000 mg | Freq: Once | INTRAMUSCULAR | Status: DC | PRN

## 2017-07-08 MED ORDER — ACETAMINOPHEN 10 MG/ML IV SOLN
INTRAVENOUS | Status: AC
  Filled 2017-07-08: qty 100

## 2017-07-08 MED ORDER — DEXAMETHASONE SODIUM PHOSPHATE 10 MG/ML IJ SOLN
INTRAMUSCULAR | Status: AC
  Filled 2017-07-08: qty 1

## 2017-07-08 MED ORDER — ONDANSETRON HCL 4 MG/2ML IJ SOLN
INTRAMUSCULAR | Status: DC | PRN
  Administered 2017-07-08: 4 mg via INTRAVENOUS

## 2017-07-08 MED ORDER — FENTANYL CITRATE (PF) 100 MCG/2ML IJ SOLN
INTRAMUSCULAR | Status: DC | PRN
  Administered 2017-07-08 (×3): 25 ug via INTRAVENOUS

## 2017-07-08 MED ORDER — KETOROLAC TROMETHAMINE 30 MG/ML IJ SOLN
INTRAMUSCULAR | Status: AC
  Filled 2017-07-08: qty 1

## 2017-07-08 MED ORDER — FENTANYL CITRATE (PF) 100 MCG/2ML IJ SOLN
25.0000 ug | INTRAMUSCULAR | Status: DC | PRN
  Administered 2017-07-08: 25 ug via INTRAVENOUS

## 2017-07-08 MED ORDER — LACTATED RINGERS IV SOLN: INTRAVENOUS | Status: DC

## 2017-07-08 MED ORDER — KETOROLAC TROMETHAMINE 30 MG/ML IJ SOLN
INTRAMUSCULAR | Status: DC | PRN
  Administered 2017-07-08: 30 mg via INTRAVENOUS

## 2017-07-08 MED ORDER — SILVER NITRATE-POT NITRATE 75-25 % EX MISC
CUTANEOUS | Status: AC
  Filled 2017-07-08: qty 1

## 2017-07-08 MED ORDER — LACTATED RINGERS IV SOLN
INTRAVENOUS | Status: DC
  Administered 2017-07-08: 07:00:00 via INTRAVENOUS

## 2017-07-08 MED ORDER — CEFAZOLIN SODIUM-DEXTROSE 2-4 GM/100ML-% IV SOLN
2.0000 g | Freq: Once | INTRAVENOUS | Status: AC
  Administered 2017-07-08: 2 g via INTRAVENOUS

## 2017-07-08 MED ORDER — PHENYLEPHRINE HCL 10 MG/ML IJ SOLN
INTRAMUSCULAR | Status: AC
  Filled 2017-07-08: qty 1

## 2017-07-08 MED ORDER — CEFAZOLIN SODIUM-DEXTROSE 2-4 GM/100ML-% IV SOLN
INTRAVENOUS | Status: AC
  Filled 2017-07-08: qty 100

## 2017-07-08 MED ORDER — FENTANYL CITRATE (PF) 100 MCG/2ML IJ SOLN
INTRAMUSCULAR | Status: AC
  Filled 2017-07-08: qty 2

## 2017-07-08 MED ORDER — MIDAZOLAM HCL 2 MG/2ML IJ SOLN
INTRAMUSCULAR | Status: AC
  Filled 2017-07-08: qty 2

## 2017-07-08 MED ORDER — ACETAMINOPHEN 500 MG PO TABS
ORAL_TABLET | ORAL | Status: AC
  Administered 2017-07-08: 10:00:00
  Filled 2017-07-08: qty 1

## 2017-07-08 MED ORDER — ACETAMINOPHEN 500 MG PO TABS
ORAL_TABLET | ORAL | Status: AC
  Administered 2017-07-08: 500 mg via ORAL
  Filled 2017-07-08: qty 1

## 2017-07-08 MED ORDER — PROPOFOL 10 MG/ML IV BOLUS
INTRAVENOUS | Status: DC | PRN
  Administered 2017-07-08: 50 mg via INTRAVENOUS
  Administered 2017-07-08: 150 mg via INTRAVENOUS

## 2017-07-08 MED ORDER — ONDANSETRON HCL 4 MG/2ML IJ SOLN
INTRAMUSCULAR | Status: AC
  Filled 2017-07-08: qty 2

## 2017-07-08 MED ORDER — FAMOTIDINE 20 MG PO TABS
20.0000 mg | ORAL_TABLET | Freq: Once | ORAL | Status: AC
  Administered 2017-07-08: 20 mg via ORAL

## 2017-07-08 MED ORDER — MIDAZOLAM HCL 2 MG/2ML IJ SOLN
INTRAMUSCULAR | Status: DC | PRN
  Administered 2017-07-08: 2 mg via INTRAVENOUS

## 2017-07-08 MED ORDER — PHENYLEPHRINE HCL 10 MG/ML IJ SOLN
INTRAMUSCULAR | Status: DC | PRN
  Administered 2017-07-08: 50 ug via INTRAVENOUS

## 2017-07-08 MED ORDER — DEXAMETHASONE SODIUM PHOSPHATE 10 MG/ML IJ SOLN
INTRAMUSCULAR | Status: DC | PRN
  Administered 2017-07-08: 5 mg via INTRAVENOUS

## 2017-07-08 SURGICAL SUPPLY — 17 items
CANISTER SUCT 1200ML W/VALVE (MISCELLANEOUS) ×3 IMPLANT
CATH ROBINSON RED A/P 16FR (CATHETERS) ×3 IMPLANT
GLOVE BIO SURGEON STRL SZ8 (GLOVE) ×3 IMPLANT
GOWN STRL REUS W/ TWL LRG LVL3 (GOWN DISPOSABLE) ×1 IMPLANT
GOWN STRL REUS W/ TWL XL LVL3 (GOWN DISPOSABLE) ×1 IMPLANT
GOWN STRL REUS W/TWL LRG LVL3 (GOWN DISPOSABLE) ×2
GOWN STRL REUS W/TWL XL LVL3 (GOWN DISPOSABLE) ×2
IV LACTATED RINGERS 1000ML (IV SOLUTION) ×3 IMPLANT
KIT TURNOVER CYSTO (KITS) ×3 IMPLANT
NOVASURE ENDOMETRIAL ABLATION (MISCELLANEOUS) ×3 IMPLANT
NS IRRIG 500ML POUR BTL (IV SOLUTION) ×3 IMPLANT
PACK DNC HYST (MISCELLANEOUS) ×3 IMPLANT
PAD OB MATERNITY 4.3X12.25 (PERSONAL CARE ITEMS) ×3 IMPLANT
PAD PREP 24X41 OB/GYN DISP (PERSONAL CARE ITEMS) ×3 IMPLANT
TOWEL OR 17X26 4PK STRL BLUE (TOWEL DISPOSABLE) ×3 IMPLANT
TUBING CONNECTING 10 (TUBING) ×2 IMPLANT
TUBING CONNECTING 10' (TUBING) ×1

## 2017-07-08 NOTE — Progress Notes (Signed)
Pt states I really want to go home and get into my bed  Feeling somewhat better

## 2017-07-08 NOTE — Op Note (Signed)
NAME: [name], Wendy Russell MEDICAL RECORD [id number] ACCOUNT [id number] DATE OF BIRTH:1970-09-14 FACILITY: [facility] LOCATION: [facility] PHYSICIAN:Cicley Ganesh Josefine Class, MD  OPERATIVE REPORT  DATE OF PROCEDURE:  07/08/2017  PREOPERATIVE DIAGNOSIS:  Abnormal uterine bleeding.  POSTOPERATIVE DIAGNOSIS:  Abnormal uterine bleeding.  PROCEDURE: 1.  Fractional dilation and curettage. 2.  Hysteroscopy. 3.  NovaSure endometrial ablation.  ANESTHESIA:  General endotracheal anesthesia.  SURGEON:  Boykin Nearing, MD  INDICATIONS:  A 47 year old gravida 3, para 2 patient with a several-month history of abnormal bleeding and inability to sample the endometrial cavity in the office.  The patient failed conservative treatment with progesterone.  DESCRIPTION OF PROCEDURE:  After adequate general endotracheal anesthesia, the patient was placed in dorsal supine position, legs in the candy cane stirrups.  Lower abdomen, perineum, and vagina were prepped and draped in normal sterile fashion.  Timeout  was performed.  The patient did receive 2 grams IV Ancef prior to commencement of the case.  Straight catheterization of the bladder yielded 50 mL clear urine.  Speculum was placed and the anterior cervix was grasped with a single tooth tenaculum.  An  endocervical curettage was performed.  The uterus sounded to 8.5 cm.  Cervix was then dilated to #18 Hanks dilator without difficulty.  An endometrial curettage was performed with adequate tissue.  The hysteroscope was then advanced into the endometrial  cavity and no specific defects were identified.  The endometrial cavity did appear to be slightly hemorrhagic potentially consistent with endometritis.  The NovaSure ablator was then brought up to the operative field.  Based on the sounding length of 8.5  cm and the estimated cervical length of 4 cm, the cavity length was approximated at 4.5 cm.  The ablator was placed into the endometrial cavity without  difficulty and the array was opened.  Uterine width estimated 2.8 cm.  Cavity assessment test was  performed and passed.  Based on the measurements, the power setting of 69, the ablation then took place for 2 minutes.  The ablator was removed and repeat hysteroscopy showed normal charring effect of the endometrial cavity.    There were no complications.  The patient tolerated the procedure well.  ESTIMATED BLOOD LOSS:  Minimal.  INTRAOPERATIVE FLUIDS:  600 mL .    URINE OUTPUT:  50 mL.  AN/NUANCE  D:07/08/2017 T:07/08/2017 JOB:[id number]

## 2017-07-08 NOTE — Brief Op Note (Signed)
07/08/2017  8:21 AM  PATIENT:  Wendy Russell  47 y.o. female  PRE-OPERATIVE DIAGNOSIS:  menorrhagia  POST-OPERATIVE DIAGNOSIS:  menorrhagia  PROCEDURE:  Procedure(s): DILATATION & CURETTAGE/HYSTEROSCOPY WITH NOVASURE ABLATION (N/A)  SURGEON:  Surgeon(s) and Role:    * Breion Novacek, Gwen Her, MD - Primary  PHYSICIAN ASSISTANT:   ASSISTANTS: none   ANESTHESIA:   general  EBL:  5 mL   BLOOD ADMINISTERED:none  DRAINS: none   LOCAL MEDICATIONS USED:  NONE  SPECIMEN:  Source of Specimen:  ecc , endometrial curettings   DISPOSITION OF SPECIMEN:  PATHOLOGY  COUNTS:  YES  TOURNIQUET:  * No tourniquets in log *  DICTATION: .Other Dictation: Dictation Number verbal   PLAN OF CARE: Discharge to home after PACU  PATIENT DISPOSITION:  PACU - hemodynamically stable.   Delay start of Pharmacological VTE agent (>24hrs) due to surgical blood loss or risk of bleeding: not applicable

## 2017-07-08 NOTE — Anesthesia Preprocedure Evaluation (Signed)
Anesthesia Evaluation  Patient identified by MRN, date of birth, ID band Patient awake    Reviewed: Allergy & Precautions, NPO status , Patient's Chart, lab work & pertinent test results  History of Anesthesia Complications Negative for: history of anesthetic complications  Airway Mallampati: III       Dental   Pulmonary neg sleep apnea, neg COPD, Current Smoker,           Cardiovascular hypertension (no meds, may be related to HAs), (-) Past MI and (-) CHF (-) dysrhythmias (-) Valvular Problems/Murmurs     Neuro/Psych neg Seizures Anxiety    GI/Hepatic Neg liver ROS, GERD  ,  Endo/Other  neg diabetes  Renal/GU negative Renal ROS     Musculoskeletal   Abdominal   Peds  Hematology  (+) anemia ,   Anesthesia Other Findings   Reproductive/Obstetrics                             Anesthesia Physical Anesthesia Plan  ASA: II  Anesthesia Plan: General   Post-op Pain Management:    Induction: Intravenous  PONV Risk Score and Plan: 2 and Dexamethasone and Ondansetron  Airway Management Planned: LMA  Additional Equipment:   Intra-op Plan:   Post-operative Plan:   Informed Consent: I have reviewed the patients History and Physical, chart, labs and discussed the procedure including the risks, benefits and alternatives for the proposed anesthesia with the patient or authorized representative who has indicated his/her understanding and acceptance.     Plan Discussed with:   Anesthesia Plan Comments:         Anesthesia Quick Evaluation

## 2017-07-08 NOTE — Transfer of Care (Signed)
Immediate Anesthesia Transfer of Care Note  Patient: Wendy Russell  Procedure(s) Performed: DILATATION & CURETTAGE/HYSTEROSCOPY WITH NOVASURE ABLATION (N/A )  Patient Location: PACU  Anesthesia Type:General  Level of Consciousness: awake, alert  and oriented  Airway & Oxygen Therapy: Patient Spontanous Breathing  Post-op Assessment: Report given to RN, Post -op Vital signs reviewed and stable and Patient moving all extremities  Post vital signs: Reviewed and stable  Last Vitals:  Vitals Value Taken Time  BP 150/79 07/08/2017  8:38 AM  Temp    Pulse 63 07/08/2017  8:38 AM  Resp 15 07/08/2017  8:39 AM  SpO2 97 % 07/08/2017  8:38 AM  Vitals shown include unvalidated device data.  Last Pain:  Vitals:   07/08/17 0614  TempSrc: Tympanic  PainSc: 3          Complications: No apparent anesthesia complications

## 2017-07-08 NOTE — Anesthesia Post-op Follow-up Note (Signed)
Anesthesia QCDR form completed.        

## 2017-07-08 NOTE — Anesthesia Postprocedure Evaluation (Signed)
Anesthesia Post Note  Patient: Wendy Russell  Procedure(s) Performed: DILATATION & CURETTAGE/HYSTEROSCOPY WITH NOVASURE ABLATION (N/A )  Patient location during evaluation: PACU Anesthesia Type: General Level of consciousness: awake and alert Pain management: pain level controlled Vital Signs Assessment: post-procedure vital signs reviewed and stable Respiratory status: spontaneous breathing and respiratory function stable Cardiovascular status: stable Anesthetic complications: no     Last Vitals:  Vitals:   07/08/17 0614  BP: (!) 150/90  Pulse: (!) 57  Resp: 18  Temp: (!) 36.1 C  SpO2: 100%    Last Pain:  Vitals:   07/08/17 0614  TempSrc: Tympanic  PainSc: 3                  Deamber Buckhalter K

## 2017-07-08 NOTE — OR Nursing (Signed)
Patient has urgency to void attempted x2

## 2017-07-08 NOTE — Anesthesia Procedure Notes (Signed)
Procedure Name: LMA Insertion Date/Time: 07/08/2017 7:44 AM Performed by: Lowry Bowl, CRNA Pre-anesthesia Checklist: Patient identified, Emergency Drugs available, Suction available and Patient being monitored Patient Re-evaluated:Patient Re-evaluated prior to induction Oxygen Delivery Method: Circle system utilized Preoxygenation: Pre-oxygenation with 100% oxygen Induction Type: IV induction Ventilation: Mask ventilation without difficulty LMA: LMA inserted LMA Size: 3.0 Number of attempts: 1 Placement Confirmation: breath sounds checked- equal and bilateral and positive ETCO2 Tube secured with: Tape Dental Injury: Teeth and Oropharynx as per pre-operative assessment

## 2017-07-08 NOTE — Progress Notes (Signed)
Ready for surgery  All questions answered

## 2017-07-11 LAB — SURGICAL PATHOLOGY

## 2017-11-15 ENCOUNTER — Other Ambulatory Visit: Payer: Self-pay

## 2017-11-15 ENCOUNTER — Emergency Department
Admission: EM | Admit: 2017-11-15 | Discharge: 2017-11-15 | Disposition: A | Discharge status: Home / Self Care | Payer: Commercial Managed Care - PPO | Attending: Emergency Medicine | Admitting: Emergency Medicine

## 2017-11-15 ENCOUNTER — Emergency Department: Payer: Commercial Managed Care - PPO

## 2017-11-15 ENCOUNTER — Encounter: Payer: Self-pay | Admitting: Emergency Medicine

## 2017-11-15 DIAGNOSIS — Z79899 Other long term (current) drug therapy: Secondary | ICD-10-CM | POA: Insufficient documentation

## 2017-11-15 DIAGNOSIS — N8500 Endometrial hyperplasia, unspecified: Secondary | ICD-10-CM | POA: Diagnosis not present

## 2017-11-15 DIAGNOSIS — R1032 Left lower quadrant pain: Secondary | ICD-10-CM | POA: Insufficient documentation

## 2017-11-15 DIAGNOSIS — F17210 Nicotine dependence, cigarettes, uncomplicated: Secondary | ICD-10-CM | POA: Insufficient documentation

## 2017-11-15 DIAGNOSIS — I10 Essential (primary) hypertension: Secondary | ICD-10-CM | POA: Insufficient documentation

## 2017-11-15 DIAGNOSIS — R9389 Abnormal findings on diagnostic imaging of other specified body structures: Secondary | ICD-10-CM

## 2017-11-15 LAB — COMPREHENSIVE METABOLIC PANEL
ALBUMIN: 4 g/dL (ref 3.5–5.0)
ALK PHOS: 46 U/L (ref 38–126)
ALT: 19 U/L (ref 0–44)
ANION GAP: 9 (ref 5–15)
AST: 20 U/L (ref 15–41)
BUN: 13 mg/dL (ref 6–20)
CALCIUM: 9.2 mg/dL (ref 8.9–10.3)
CHLORIDE: 104 mmol/L (ref 98–111)
CO2: 24 mmol/L (ref 22–32)
Creatinine, Ser: 0.79 mg/dL (ref 0.44–1.00)
GFR calc non Af Amer: >60 mL/min (ref >60)
GLUCOSE: 81 mg/dL (ref 70–99)
POTASSIUM: 4 mmol/L (ref 3.5–5.1)
SODIUM: 137 mmol/L (ref 135–145)
Total Bilirubin: 0.6 mg/dL (ref 0.3–1.2)
Total Protein: 7.1 g/dL (ref 6.5–8.1)

## 2017-11-15 LAB — URINALYSIS, COMPLETE (UACMP) WITH MICROSCOPIC
BILIRUBIN URINE: NEGATIVE
Glucose, UA: NEGATIVE mg/dL
KETONES UR: NEGATIVE mg/dL
LEUKOCYTES UA: NEGATIVE
Nitrite: NEGATIVE
PH: 5 (ref 5.0–8.0)
PROTEIN: NEGATIVE mg/dL
Specific Gravity, Urine: 1.015 (ref 1.005–1.030)

## 2017-11-15 LAB — CBC
HEMATOCRIT: 47.1 % — AB (ref 36.0–46.0)
HEMOGLOBIN: 16.3 g/dL — AB (ref 12.0–15.0)
MCH: 34.7 pg — AB (ref 26.0–34.0)
MCHC: 34.6 g/dL (ref 30.0–36.0)
MCV: 100.2 fL — ABNORMAL HIGH (ref 80.0–100.0)
NRBC: 0 % (ref 0.0–0.2)
Platelets: 392 10*3/uL (ref 150–400)
RBC: 4.7 MIL/uL (ref 3.87–5.11)
RDW: 13.2 % (ref 11.5–15.5)
WBC: 11.3 10*3/uL — ABNORMAL HIGH (ref 4.0–10.5)

## 2017-11-15 LAB — POCT PREGNANCY, URINE: Preg Test, Ur: NEGATIVE

## 2017-11-15 LAB — HCG, QUANTITATIVE, PREGNANCY

## 2017-11-15 LAB — LIPASE, BLOOD: LIPASE: 37 U/L (ref 11–51)

## 2017-11-15 MED ORDER — FENTANYL CITRATE (PF) 100 MCG/2ML IJ SOLN
50.0000 ug | Freq: Once | INTRAMUSCULAR | Status: AC
  Administered 2017-11-15: 50 ug via INTRAVENOUS
  Filled 2017-11-15: qty 2

## 2017-11-15 MED ORDER — ONDANSETRON HCL 4 MG/2ML IJ SOLN
4.0000 mg | Freq: Once | INTRAMUSCULAR | Status: AC
  Administered 2017-11-15: 4 mg via INTRAVENOUS
  Filled 2017-11-15: qty 2

## 2017-11-15 NOTE — ED Provider Notes (Signed)
[facility] Emergency Department Provider Note  ____________________________________________  Time seen: Approximately 8:32 AM  I have reviewed the triage vital signs and the nursing notes.   HISTORY  Chief Complaint Abdominal Pain   HPI Wendy Russell is a 47 y.o. female history of ovarian cyst, anemia, migraine headaches who presents for evaluation of abdominal pain.  Patient reports sudden onset of severe sharp left lower quadrant abdominal pain that woke her up this morning.  The pain is constant but intensity varies.  Currently 3 out of 10 but has been 10 out of 10 before arrival.  Pain radiates to her back. No nausea, vomiting, dysuria, hematuria, diarrhea, constipation, fever or chills.  Patient denies any prior history of kidney stones.  Patient has had several abdominal surgeries including C-sections, tubal ligation, and a recent D&C for abnormal uterine bleeding in June 2019.  She reports that her menses have been abnormal and irregular since the D&C.  She reports recently finished antibiotics for urinary tract infection 3 days ago with full resolution of her symptoms.  Last normal BM was last night.  No history of SBO.  Past Medical History:  Diagnosis Date  . Anemia    H/O  . Anxiety   . GERD (gastroesophageal reflux disease)    OCC-TUMS PRN  . Hypertension    H/O OFF MEDS SINCE 2018 DUE TO BP CONTROL  . Migraines    MIGRAINES    There are no active problems to display for this patient.   Past Surgical History:  Procedure Laterality Date  . [place] WITH NOVASURE ABLATION N/A 07/08/2017   Procedure: DILATATION & CURETTAGE/HYSTEROSCOPY WITH NOVASURE ABLATION;  Surgeon: Schermerhorn, Gwen Her, MD;  Location: [facility] ORS;  Service: Gynecology;  Laterality: N/A;  . TUBAL LIGATION  1993  . WISDOM TOOTH EXTRACTION      Prior to Admission medications   Medication Sig Start Date End Date  Taking? Authorizing Provider  ibuprofen (ADVIL,MOTRIN) 400 MG tablet Take 400 mg by mouth every 6 (six) hours as needed.   Yes [provider]  nortriptyline (PAMELOR) 25 MG capsule Take 1 capsule by mouth at bedtime. 06/22/17  Yes [provider]  SUMAtriptan (IMITREX) 100 MG tablet Take 100 mg by mouth every 2 (two) hours as needed for migraine.  12/08/15  Yes [provider]  calcium carbonate (TUMS - DOSED IN MG ELEMENTAL CALCIUM) 500 MG chewable tablet Chew 1 tablet by mouth as needed for indigestion or heartburn.    [provider]  nicotine (NICODERM CQ - DOSED IN MG/24 HOURS) 21 mg/24hr patch Place 21 mg onto the skin as needed.    [provider]    Allergies Codeine; Aspirin; and Doxycycline  Family History  Problem Relation Age of Onset  . Breast cancer Mother 65    Social History Social History   Tobacco Use  . Smoking status: Current Every Day Smoker    Packs/day: 0.50    Years: 23.00    Pack years: 11.50    Types: Cigarettes  . Smokeless tobacco: Never Used  Substance Use Topics  . Alcohol use: Yes    Comment: RARE  . Drug use: No    Review of Systems  Constitutional: Negative for fever. Eyes: Negative for visual changes. ENT: Negative for sore throat. Neck: No neck pain  Cardiovascular: Negative for chest pain. Respiratory: Negative for shortness of breath. Gastrointestinal: +LLQ  abdominal pain.  No vomiting or diarrhea. Genitourinary: Negative for dysuria. Musculoskeletal: Negative for back pain. Skin: Negative for rash. Neurological: Negative for headaches, weakness or numbness. Psych: No SI or HI  ____________________________________________   PHYSICAL EXAM:  VITAL SIGNS: ED Triage Vitals  Enc Vitals Group     BP 11/15/17 0808 (!) 133/98     Pulse Rate 11/15/17 0808 69     Resp 11/15/17 0808 16     Temp 11/15/17 0808 97.6 F (36.4 C)     Temp Source 11/15/17 0808 Oral     SpO2 11/15/17 0808 97 %       Weight --      Height --      Head Circumference --      Peak Flow --      Pain Score 11/15/17 0807 8     Pain Loc --      Pain Edu? --      Excl. in [place]? --     Constitutional: Alert and oriented, looks uncomfortable.  HEENT:      Head: Normocephalic and atraumatic.         Eyes: Conjunctivae are normal. Sclera is non-icteric.       Mouth/Throat: Mucous membranes are moist.       Neck: Supple with no signs of meningismus. Cardiovascular: Regular rate and rhythm. No murmurs, gallops, or rubs. 2+ symmetrical distal pulses are present in all extremities. No JVD. Respiratory: Normal respiratory effort. Lungs are clear to auscultation bilaterally. No wheezes, crackles, or rhonchi.  Gastrointestinal: Soft, tender to palpation on the L quadranst, and non distended with positive bowel sounds. No rebound or guarding. Genitourinary: No CVA tenderness. Musculoskeletal: Nontender with normal range of motion in all extremities. No edema, cyanosis, or erythema of extremities. Neurologic: Normal speech and language. Face is symmetric. Moving all extremities. No gross focal neurologic deficits are appreciated. Skin: Skin is warm, dry and intact. No rash noted. Psychiatric: Mood and affect are normal. Speech and behavior are normal.  ____________________________________________   LABS (all labs ordered are listed, but only abnormal results are displayed)  Labs Reviewed  CBC - Abnormal; Notable for the following components:      Result Value   WBC 11.3 (*)    Hemoglobin 16.3 (*)    HCT 47.1 (*)    MCV 100.2 (*)    MCH 34.7 (*)    All other components within normal limits  URINALYSIS, COMPLETE (UACMP) WITH MICROSCOPIC - Abnormal; Notable for the following components:   Color, Urine YELLOW (*)    APPearance CLEAR (*)    Hgb urine dipstick SMALL (*)    Bacteria, UA RARE (*)    All other components within normal limits  LIPASE, BLOOD  COMPREHENSIVE METABOLIC PANEL  HCG, QUANTITATIVE,  PREGNANCY  POC URINE PREG, ED  POCT PREGNANCY, URINE   ____________________________________________  EKG  none  ____________________________________________  RADIOLOGY  I have personally reviewed the images performed during this visit and I agree with the Radiologist's read.   Interpretation by Radiologist:  US Pelvis Transvanginal Non-ob (tv Only)  Result Date: 11/15/2017 CLINICAL DATA:  Left lower quadrant pain. EXAM: TRANSABDOMINAL AND TRANSVAGINAL ULTRASOUND OF PELVIS DOPPLER ULTRASOUND OF OVARIES TECHNIQUE: Both transabdominal and transvaginal ultrasound examinations of the pelvis were performed. Transabdominal technique was performed for global imaging of the pelvis including uterus, ovaries, adnexal regions, and pelvic cul-de-sac. It was necessary to proceed with endovaginal exam following the transabdominal exam to visualize the endometrium and adnexa. Color and duplex Doppler  ultrasound was utilized to evaluate blood flow to the ovaries. COMPARISON:  CT abdomen and pelvis this same day. Pelvic ultrasound 04/16/2017. FINDINGS: Uterus Measurements: 9.1 x 5.2 x 5.7 cm. No fibroids or other mass visualized. Endometrium Thickness: 18 mm. Hypoechoic material and debris are seen within the endometrial canal. Right ovary Measurements: 2.0 cm.  Normal appearance/no adnexal mass. Left ovary Measurements: 2.9 cm.  Normal appearance/no adnexal mass. Pulsed Doppler evaluation of both ovaries demonstrates normal low-resistance arterial and venous waveforms. Other findings No abnormal free fluid. IMPRESSION: Endometrial thickness is considered abnormal. Consider follow-up by [place] in 6-8 weeks, during the week immediately following menses (exam timing is critical). Normal appearing ovaries.  Negative for ovarian torsion. Electronically Signed   By: Inge Rise M.D.   On: 11/15/2017 11:21   US Pelvis (transabdominal Only)  Result Date: 11/15/2017 CLINICAL DATA:  Left lower quadrant pain. EXAM:  TRANSABDOMINAL AND TRANSVAGINAL ULTRASOUND OF PELVIS DOPPLER ULTRASOUND OF OVARIES TECHNIQUE: Both transabdominal and transvaginal ultrasound examinations of the pelvis were performed. Transabdominal technique was performed for global imaging of the pelvis including uterus, ovaries, adnexal regions, and pelvic cul-de-sac. It was necessary to proceed with endovaginal exam following the transabdominal exam to visualize the endometrium and adnexa. Color and duplex Doppler ultrasound was utilized to evaluate blood flow to the ovaries. COMPARISON:  CT abdomen and pelvis this same day. Pelvic ultrasound 04/16/2017. FINDINGS: Uterus Measurements: 9.1 x 5.2 x 5.7 cm. No fibroids or other mass visualized. Endometrium Thickness: 18 mm. Hypoechoic material and debris are seen within the endometrial canal. Right ovary Measurements: 2.0 cm.  Normal appearance/no adnexal mass. Left ovary Measurements: 2.9 cm.  Normal appearance/no adnexal mass. Pulsed Doppler evaluation of both ovaries demonstrates normal low-resistance arterial and venous waveforms. Other findings No abnormal free fluid. IMPRESSION: Endometrial thickness is considered abnormal. Consider follow-up by [place] in 6-8 weeks, during the week immediately following menses (exam timing is critical). Normal appearing ovaries.  Negative for ovarian torsion. Electronically Signed   By: Inge Rise M.D.   On: 11/15/2017 11:21   US Pelvic Doppler (torsion R/o Or Mass Arterial Flow)  Result Date: 11/15/2017 CLINICAL DATA:  Left lower quadrant pain. EXAM: TRANSABDOMINAL AND TRANSVAGINAL ULTRASOUND OF PELVIS DOPPLER ULTRASOUND OF OVARIES TECHNIQUE: Both transabdominal and transvaginal ultrasound examinations of the pelvis were performed. Transabdominal technique was performed for global imaging of the pelvis including uterus, ovaries, adnexal regions, and pelvic cul-de-sac. It was necessary to proceed with endovaginal exam following the transabdominal exam to visualize the  endometrium and adnexa. Color and duplex Doppler ultrasound was utilized to evaluate blood flow to the ovaries. COMPARISON:  CT abdomen and pelvis this same day. Pelvic ultrasound 04/16/2017. FINDINGS: Uterus Measurements: 9.1 x 5.2 x 5.7 cm. No fibroids or other mass visualized. Endometrium Thickness: 18 mm. Hypoechoic material and debris are seen within the endometrial canal. Right ovary Measurements: 2.0 cm.  Normal appearance/no adnexal mass. Left ovary Measurements: 2.9 cm.  Normal appearance/no adnexal mass. Pulsed Doppler evaluation of both ovaries demonstrates normal low-resistance arterial and venous waveforms. Other findings No abnormal free fluid. IMPRESSION: Endometrial thickness is considered abnormal. Consider follow-up by [place] in 6-8 weeks, during the week immediately following menses (exam timing is critical). Normal appearing ovaries.  Negative for ovarian torsion. Electronically Signed   By: Inge Rise M.D.   On: 11/15/2017 11:21   Ct Renal Stone Study  Result Date: 11/15/2017 CLINICAL DATA:  Left lower quadrant abdominal pain since 4 o'clock this morning. Evaluate for nephrolithiasis. EXAM: CT  ABDOMEN AND PELVIS WITHOUT CONTRAST TECHNIQUE: Multidetector CT imaging of the abdomen and pelvis was performed following the standard protocol without IV contrast. COMPARISON:  None. FINDINGS: The lack of intravenous contrast limits the ability to evaluate solid abdominal organs. Lower chest: Limited visualization lower thorax demonstrates minimal subsegmental atelectasis within the imaged bilateral lung bases. Note is made of a approximately 0.6 cm subpleural nodule within the left lower lobe (image 20, series 2). No pleural effusion. Normal heart size.  No pericardial effusion. Hepatobiliary: Normal hepatic contour. There are several peripherally calcified stones otherwise normal-appearing gallbladder. Dominant stone located within the neck of an measures approximately 1.4 x 1.2 cm (image 28,  series 2). No definitive gallbladder wall thickening or pericholecystic fluid on this noncontrast examination. No ascites. Pancreas: Normal noncontrast appearance of the pancreas Spleen: Normal noncontrast appearance the spleen Adrenals/Urinary Tract: There is an approximately 0.8 cm hypoattenuating lesion within the interpolar aspect the left kidney (image 33, series 2), too small to adequately characterize though likely representative of a renal cyst. Punctate (sub 1 mm) renal stones are suspected bilaterally (representative coronal images 78, 81 and 93, series 5). No renal stones are seen along expected course of either ureter or the urinary bladder. Punctate phleboliths seen within the right hemipelvis. Urinary bladder is decompressed. No urine obstruction or perinephric stranding. Normal noncontrast appearance the bilateral adrenal glands. Stomach/Bowel: Bowel is normal in course and caliber without wall thickening or evidence of enteric obstruction. Normal noncontrast appearance of the terminal ileum and retrocecal appendix. No pneumoperitoneum, pneumatosis or portal venous gas. Vascular/Lymphatic: Minimal amount of calcified atherosclerotic plaque with a normal caliber abdominal aorta. No bulky retroperitoneal, mesenteric, pelvic or inguinal lymphadenopathy on this noncontrast examination. Reproductive: Note is made of 2 hypoattenuating left-sided presumably physiologic adnexal cysts with dominant presumed cyst measuring approximately 2.4 x 1.7 cm (axial image 68, series 2) additional presumed cyst measuring approximately 1.7 x 1.3 cm (coronal image 68, series 5). No discrete right-sided adnexal lesion. Small amount of fluid is seen within the endometrial canal. No free fluid the pelvic cul-de-sac. Other: Regional soft tissues are normal. Musculoskeletal: No acute or aggressive osseous abnormalities mild degenerative change of the pubic symphysis. IMPRESSION: 1. Note is made of two left-sided presumably  physiologic adnexal cysts - Further evaluation could be performed pelvic ultrasound as clinically indicated. Otherwise, no explanation patient's left lower quadrant abdominal pain. Specifically, no evidence of enteric or urinary obstruction. Normal noncontrast appearance of the appendix. 2. Cholelithiasis without evidence of cholecystitis on this noncontrast examination. 3. Suspected punctate (sub 1 mm) bilateral nephrolithiasis. 4. Note is made of an approximately 0.6 cm left lower lobe pulmonary nodule. Non-contrast chest CT at 6-12 months is recommended. If the nodule is stable at time of repeat CT, then future CT at 18-24 months (from today's scan) is considered optional for low-risk patients, but is recommended for high-risk patients. This recommendation follows the consensus statement: Guidelines for Management of Incidental Pulmonary Nodules Detected on CT Images: From the Fleischner Society 2017; Radiology 2017; 284:228-243. Electronically Signed   By: Sandi Mariscal M.D.   On: 11/15/2017 09:18      ____________________________________________   PROCEDURES  Procedure(s) performed: None Procedures Critical Care performed:  None ____________________________________________   INITIAL IMPRESSION / ASSESSMENT AND PLAN / ED COURSE   47 y.o. female history of ovarian cyst, anemia, migraine headaches who presents for evaluation of sudden onset severe sharp LLQ abdominal pain.  Differential diagnoses including kidney stone versus ovarian torsion versus ovarian cyst versus ectopic  versus TOA versus cystitis versus PID/STD versus SBO.  Plan for CBC, CMP, lipase, UA.  New pregnancy negative.  We will do hCG to rule out ectopic.  We will send patient for CT renal.  Will give IV fentanyl and Zofran for symptom relief.  Clinical Course as of Nov 15 1213  Tue Nov 15, 2017  1214 CT negative for any acute findings.  Pelvic ultrasound showing thickened endometrium patient is currently being followed for that  by OB/GYN.  Had recent biopsy which was negative for cancer.  No other acute findings.  Her pain has resolved.  Will discharge home with follow-up with OB/GYN.  Discussed my standard return precautions.   [CV]    Clinical Course User Index [CV] Alfred Levins, [place], MD   Recommended a pelvic exam to rule out PID/STD but patient refused.  She is in a monogamous relationship with her husband and has not had any prior history of STDs.  She also reports that she underwent testing recently by her OB/GYN.    As part of my medical decision making, I reviewed the following data within the [facility] notes reviewed and incorporated, Labs reviewed , Old chart reviewed, Radiograph reviewed , Notes from prior ED visits and [place] Controlled Substance Database    Pertinent labs & imaging results that were available during my care of the patient were reviewed by me and considered in my medical decision making (see chart for details).    ____________________________________________   FINAL CLINICAL IMPRESSION(S) / ED DIAGNOSES  Final diagnoses:  LLQ abdominal pain  Thickened endometrium      NEW MEDICATIONS STARTED DURING THIS VISIT:  ED Discharge Orders    None       Note:  This document was prepared using Dragon voice recognition software and may include unintentional dictation errors.    Rudene Re, MD 11/15/17 [id number]

## 2017-11-15 NOTE — ED Notes (Signed)
Pt taken to US at this time

## 2017-11-15 NOTE — ED Notes (Signed)
Pt ambulatory to treatment room c/o LLQ abd pain since 4am this AM. States pain woke her up. Pt states moving around helps with pain, worse when sitting. Denies N&V&D& fever. Denies hx of kidney stone. Finished antibiotics on Saturday for UTI. States cervical ablation in June, had ovarian cyst rupture in January. Denies problems since procedures. Alert and oriented. Appears uncomfortable.

## 2017-11-15 NOTE — ED Notes (Signed)
Pt taken to CT via wheelchair.

## 2017-11-15 NOTE — ED Notes (Signed)
Pt verbalized husband was coming to pick her up from lobby.

## 2017-11-15 NOTE — Discharge Instructions (Addendum)

## 2017-11-15 NOTE — ED Triage Notes (Addendum)
PT to ED via [facility] for severe LLQ abd pain since this am. Denies n/v/d.  Recent dx  With UTI and completed antibiotics.  Hx of lft ovarian cyst rupture. VSS , pt appears uncomfortable. PT refuses WC due to sitting makes pain worse.

## 2017-11-21 NOTE — H&P (Signed)
Wendy Russell is a 47 y.o. female here for ED follow up .pt with a 6  day history of LLQ and left lower back pain . Come on suddenly . S/p Novasure with no bleeding since . Seen in the ED on 11/15/17 and CTScan / U/S showed a thickened stripe of 18 mm  Her pain has been sharp and stabbing and pain now radiates down her leg . Pain worse when laying down . Decreased appetite and BM are normal . No N/V  TVU/S in ED :  TRANSABDOMINAL AND TRANSVAGINAL ULTRASOUND OF PELVIS  DOPPLER ULTRASOUND OF OVARIES  TECHNIQUE: Both transabdominal and transvaginal ultrasound examinations of the pelvis were performed. Transabdominal technique was performed for global imaging of the pelvis including uterus, ovaries, adnexal regions, and pelvic cul-de-sac.  It was necessary to proceed with endovaginal exam following the transabdominal exam to visualize the endometrium and adnexa. Color and duplex Doppler ultrasound was utilized to evaluate blood flow to the ovaries.  COMPARISON:CT abdomen and pelvis this same day. Pelvic ultrasound 04/16/2017.  FINDINGS: Uterus  Measurements: 9.1 x 5.2 x 5.7 cm. No fibroids or other mass visualized.  Endometrium  Thickness: 18 mm. Hypoechoic material and debris are seen within the endometrial canal.  Right ovary  Measurements: 2.0 cm.Normal appearance/no adnexal mass.  Left ovary  Measurements: 2.9 cm.Normal appearance/no adnexal mass.  Pulsed Doppler evaluation of both ovaries demonstrates normal low-resistance arterial and venous waveforms.  Other findings  No abnormal free fluid.  IMPRESSION: Endometrial thickness is considered abnormal. Consider follow-up by [place] in 6-8 weeks, during the week immediately following menses (exam timing is critical).  Normal appearing ovaries.Negative for ovarian torsion.   Electronically Signed ByAlphonse Guild M.D. On: 11/15/2017 11:21  Other Result Information  Interface, Rad Results In -  11/15/2017 11:24 AM EDT CLINICAL DATA:  Left lower quadrant pain.  EXAM: TRANSABDOMINAL AND TRANSVAGINAL ULTRASOUND OF PELVIS  DOPPLER ULTRASOUND OF OVARIES  TECHNIQUE: Both transabdominal and transvaginal ultrasound examinations of the pelvis were performed. Transabdominal technique was performed for global imaging of the pelvis including uterus, ovaries, adnexal regions, and pelvic cul-de-sac.  It was necessary to proceed with endovaginal exam following the transabdominal exam to visualize the endometrium and adnexa. Color and duplex Doppler ultrasound was utilized to evaluate blood flow to the ovaries.  COMPARISON:  CT abdomen and pelvis this same day. Pelvic ultrasound 04/16/2017.  FINDINGS: Uterus  Measurements: 9.1 x 5.2 x 5.7 cm. No fibroids or other mass visualized.  Endometrium  Thickness: 18 mm. Hypoechoic material and debris are seen within the endometrial canal.  Right ovary  Measurements: 2.0 cm.  Normal appearance/no adnexal mass.  Left ovary  Measurements: 2.9 cm.  Normal appearance/no adnexal mass.  Pulsed Doppler evaluation of both ovaries demonstrates normal low-resistance arterial and venous waveforms.  Other findings  No abnormal free fluid.  IMPRESSION: Endometrial thickness is considered abnormal. Consider follow-up by [place] in 6-8 weeks, during the week immediately following menses (exam timing is critical).  Normal appearing ovaries.  Negative for ovarian torsion   ctscan 11/15/17 at [facility] WITHOUT CONTRAST  TECHNIQUE: Multidetector CT imaging of the abdomen and pelvis was performed following the standard protocol without IV contrast.  COMPARISON:None.  FINDINGS: The lack of intravenous contrast limits the ability to evaluate solid abdominal organs.  Lower chest: Limited visualization lower thorax demonstrates minimal subsegmental atelectasis within the imaged bilateral lung bases. Note is made of a  approximately 0.6 cm subpleural nodule within the left lower lobe (  image 20, series 2). No pleural effusion.  Normal heart size.No pericardial effusion.  Hepatobiliary: Normal hepatic contour. There are several peripherally calcified stones otherwise normal-appearing gallbladder. Dominant stone located within the neck of an measures approximately 1.4 x 1.2 cm (image 28, series 2). No definitive gallbladder wall thickening or pericholecystic fluid on this noncontrast examination. No ascites.  Pancreas: Normal noncontrast appearance of the pancreas  Spleen: Normal noncontrast appearance the spleen  Adrenals/Urinary Tract: There is an approximately 0.8 cm hypoattenuating lesion within the interpolar aspect the left kidney (image 33, series 2), too small to adequately characterize though likely representative of a renal cyst. Punctate (sub 1 mm) renal stones are suspected bilaterally (representative coronal images 78, 81 and 93, series 5). No renal stones are seen along expected course of either ureter or the urinary bladder. Punctate phleboliths seen within the right hemipelvis. Urinary bladder is decompressed. No urine obstruction or perinephric stranding.  Normal noncontrast appearance the bilateral adrenal glands.  Stomach/Bowel: Bowel is normal in course and caliber without wall thickening or evidence of enteric obstruction. Normal noncontrast appearance of the terminal ileum and retrocecal appendix. No pneumoperitoneum, pneumatosis or portal venous gas.  Vascular/Lymphatic: Minimal amount of calcified atherosclerotic plaque with a normal caliber abdominal aorta.  No bulky retroperitoneal, mesenteric, pelvic or inguinal lymphadenopathy on this noncontrast examination.  Reproductive: Note is made of 2 hypoattenuating left-sided presumably physiologic adnexal cysts with dominant presumed cyst measuring approximately 2.4 x 1.7 cm (axial image 68, series 2) additional  presumed cyst measuring approximately 1.7 x 1.3 cm (coronal image 68, series 5). No discrete right-sided adnexal lesion. Small amount of fluid is seen within the endometrial canal. No free fluid the pelvic cul-de-sac.  Other: Regional soft tissues are normal.  Musculoskeletal: No acute or aggressive osseous abnormalities mild degenerative change of the pubic symphysis.  IMPRESSION: 1. Note is made of two left-sided presumably physiologic adnexal cysts - Further evaluation could be performed pelvic ultrasound as clinically indicated. Otherwise, no explanation patient's left lower quadrant abdominal pain. Specifically, no evidence of enteric or urinary obstruction. Normal noncontrast appearance of the appendix. 2. Cholelithiasis without evidence of cholecystitis on this noncontrast examination. 3. Suspected punctate (sub 1 mm) bilateral nephrolithiasis. 4. Note is made of an approximately 0.6 cm left lower lobe pulmonary nodule. Non-contrast chest CT at 6-12 months is recommended. If the nodule is stable at time of repeat CT, then future CT at 18-24 months (from today's scan) is considered optional for low-risk patients, but is recommended for high-risk patients. This recommendation follows the consensus statement: Guidelines for Management of Incidental Pulmonary Nodules Detected on CT Images: From the Fleischner Society 2017; Radiology 2017; 284:228-243.   Electronically Signed By: Joaquim Lai M.D. On: 11/15/2017 09:18    She said that she had similar pain before I did her Novasure ablation dec20  Past Medical History:  has a past medical history of Abnormal vaginal bleeding, unspecified and Migraine.  Past Surgical History:  has a past surgical history that includes Cesarean section; Fractional D&C, hysteroscopy and Novasure (07/08/2017); Dilation and curettage of uterus (07/08/2017); Endometrial ablation (07/08/2017); and Tubal ligation. Family History: family history  includes Breast cancer in her mother; Diabetes type II in her father; Migraines in her sister; Ovarian cancer in her mother. Social History:  reports that she has been smoking. She has been smoking about 0.25 packs per day. She has never used smokeless tobacco. She reports that she does not drink alcohol or use drugs. OB/GYN History:  OB History    Gravida  3   Para  2   Term  2   Preterm      AB  1   Living  2     SAB      TAB      Ectopic      Molar      Multiple      Live Births             Allergies: is allergic to codeine; aspirin; and doxycycline. Medications:  Current Outpatient Medications:  .  acetaminophen (TYLENOL) 500 mg capsule, Take 1,000 mg by mouth every 6 (six) hours, Disp: , Rfl:  .  ibuprofen (ADVIL,MOTRIN) 400 MG tablet, Take 400 mg by mouth every 6 (six) hours as needed for Pain, Disp: , Rfl:  .  nortriptyline (PAMELOR) 10 MG capsule, , Disp: , Rfl:  .  SUMAtriptan (IMITREX) 100 MG tablet, Take 1/2 to 1 tablet at headache onset. May take a second dose after 2 hours if needed., Disp: 9 tablet, Rfl: 11 .  diazePAM (VALIUM) 5 MG tablet, Take 1 tablet (5 mg total) by mouth 3 (three) times daily as needed for Anxiety May take 1 tablet before leaving home and bring the 1 tablet to the office the day of the procedure, Disp: 15 tablet, Rfl: 0 .  metaxalone (SKELAXIN) 800 mg tablet, Take 1 tablet (800 mg total) by mouth 3 (three) times daily, Disp: 30 tablet, Rfl: 0 .  nortriptyline (PAMELOR) 25 MG capsule, Take 1 capsule (25 mg total) by mouth nightly, Disp: 90 capsule, Rfl: 3  Review of Systems: General:                      No fatigue or weight loss Eyes:                           No vision changes Ears:                            No hearing difficulty Respiratory:                No cough or shortness of breath Pulmonary:                  No asthma or shortness of breath Cardiovascular:           No chest pain, palpitations, dyspnea  on exertion Gastrointestinal:          No abdominal bloating, chronic diarrhea, constipations, masses, pain or hematochezia Genitourinary:             No hematuria, dysuria, abnormal vaginal discharge,+ pelvic pain, Menometrorrhagia Lymphatic:                   No swollen lymph nodes Musculoskeletal:         No muscle weakness Neurologic:                  No extremity weakness, syncope, seizure disorder Psychiatric:                  No history of depression, delusions or suicidal/homicidal ideation    Exam:      Vitals:   11/21/17 1404  BP: (!) 122/95  Pulse: 94    Body mass index is 32.11 kg/m.  WDWN white/ female in mild destress Lungs: CTA  CV :  RRR without murmur    Neck:  no thyromegaly Abdomen: soft , no mass, normal active bowel sounds,  + LLQ TTP 2+/4, no mass Pelvic: tanner stage 5 ,  External genitalia: vulva /labia no lesions Urethra: no prolapse Vagina: normal physiologic d/c Cervix: no lesions, no cervical motion tenderness   Uterus: normal size shape and contour, non-tender Adnexa: no mass, left adnexal TTP     Attempted to Dilate the endocervical canal unsuccessfully .   M/S : + left straight leg raise . + TTP left paraspinal m.  DTR 3+/5 . Strength 3-4+ / 5 LLE   U/s today:  Uterus anteverted  Fluid filled endometrium=30.79mm; walls: 0.26 + 0.27cm=0.53cm (5.46mm)  Solid component within endometrium=2.56 x 1.69 x 2.76cm  Nabothian cyst=1.2cm  No free fluid seen  Lt ovary contains 2 simple cysts: 1)2.5cm 2)paraovarian=1.4cm  Rt ovary simple cyst=2.1cm   Impression:   The primary encounter diagnosis was Left lower quadrant pain. Diagnoses of Hematometra and Acute left-sided low back pain with left-sided sciatica were also pertinent to this visit.    Plan:   Given the expanding endometrial cavity and her pain I have offered a D+C in or  a  LSH  Back pain with sciatica may be secondary to muscle spasm   Benefits  and risks to surgery: The proposed benefit of the surgery has been discussed with the patient. The possible risks include, but are not limited to: organ injury to the bowel , bladder, ureters, and major blood vessels and nerves. There is a possibility of additional surgeries resulting from these injuries. There is also the risk of blood transfusion and the need to receive blood products during or after the procedure which may rarely lead to HIV or Hepatitis C infection. There is a risk of developing a deep venous thrombosis or a pulmonary embolism . There is the possibility of wound infection and also anesthetic complications, even the rare possibility of death. The patient understands these risks and wishes to proceed. All questions have been answered and the consent has been signed.  pt is aware of the potential risk of upstaging an occult endometrial cancer using the morcellator          .                       Add Valium 3 mg tid po #15 and skelaxin 800 mg tid prn muscle spasm  Return if symptoms worsen or fail to improve.  Caroline Sauger, MD

## 2017-11-22 ENCOUNTER — Observation Stay: Payer: Commercial Managed Care - PPO | Admitting: Registered Nurse

## 2017-11-22 ENCOUNTER — Observation Stay
Admission: RE | Admit: 2017-11-22 | Discharge: 2017-11-23 | Disposition: A | Discharge status: Home / Self Care | Payer: Commercial Managed Care - PPO | Source: Ambulatory Visit | Attending: Obstetrics and Gynecology | Admitting: Obstetrics and Gynecology

## 2017-11-22 ENCOUNTER — Other Ambulatory Visit: Payer: Self-pay

## 2017-11-22 ENCOUNTER — Encounter
Admission: RE | Disposition: A | Discharge status: Home / Self Care | Payer: Self-pay | Source: Ambulatory Visit | Attending: Obstetrics and Gynecology

## 2017-11-22 DIAGNOSIS — N857 Hematometra: Secondary | ICD-10-CM | POA: Insufficient documentation

## 2017-11-22 DIAGNOSIS — M5442 Lumbago with sciatica, left side: Secondary | ICD-10-CM | POA: Insufficient documentation

## 2017-11-22 DIAGNOSIS — N736 Female pelvic peritoneal adhesions (postinfective): Secondary | ICD-10-CM | POA: Diagnosis not present

## 2017-11-22 DIAGNOSIS — D251 Intramural leiomyoma of uterus: Principal | ICD-10-CM | POA: Insufficient documentation

## 2017-11-22 DIAGNOSIS — Z881 Allergy status to other antibiotic agents status: Secondary | ICD-10-CM | POA: Diagnosis not present

## 2017-11-22 DIAGNOSIS — F17210 Nicotine dependence, cigarettes, uncomplicated: Secondary | ICD-10-CM | POA: Insufficient documentation

## 2017-11-22 DIAGNOSIS — Z886 Allergy status to analgesic agent status: Secondary | ICD-10-CM | POA: Diagnosis not present

## 2017-11-22 DIAGNOSIS — Z885 Allergy status to narcotic agent status: Secondary | ICD-10-CM | POA: Insufficient documentation

## 2017-11-22 DIAGNOSIS — R102 Pelvic and perineal pain: Secondary | ICD-10-CM | POA: Insufficient documentation

## 2017-11-22 DIAGNOSIS — Z9889 Other specified postprocedural states: Secondary | ICD-10-CM

## 2017-11-22 HISTORY — PX: LAPAROSCOPIC BILATERAL SALPINGECTOMY: SHX5889

## 2017-11-22 HISTORY — PX: LAPAROSCOPIC SUPRACERVICAL HYSTERECTOMY: SHX5399

## 2017-11-22 LAB — BASIC METABOLIC PANEL
Anion gap: 11 (ref 5–15)
BUN: 11 mg/dL (ref 6–20)
CALCIUM: 9.3 mg/dL (ref 8.9–10.3)
CHLORIDE: 103 mmol/L (ref 98–111)
CO2: 20 mmol/L — ABNORMAL LOW (ref 22–32)
CREATININE: 0.69 mg/dL (ref 0.44–1.00)
Glucose, Bld: 89 mg/dL (ref 70–99)
Potassium: 4 mmol/L (ref 3.5–5.1)
Sodium: 134 mmol/L — ABNORMAL LOW (ref 135–145)

## 2017-11-22 LAB — CBC
HCT: 41.6 % (ref 36.0–46.0)
Hemoglobin: 14.4 g/dL (ref 12.0–15.0)
MCH: 35.1 pg — AB (ref 26.0–34.0)
MCHC: 34.6 g/dL (ref 30.0–36.0)
MCV: 101.5 fL — ABNORMAL HIGH (ref 80.0–100.0)
PLATELETS: 245 10*3/uL (ref 150–400)
RBC: 4.1 MIL/uL (ref 3.87–5.11)
RDW: 12.9 % (ref 11.5–15.5)
WBC: 9.2 10*3/uL (ref 4.0–10.5)
nRBC: 0 % (ref 0.0–0.2)

## 2017-11-22 LAB — TYPE AND SCREEN
ABO/RH(D): O POS
ANTIBODY SCREEN: NEGATIVE

## 2017-11-22 LAB — POCT PREGNANCY, URINE
Preg Test, Ur: NEGATIVE
Preg Test, Ur: NEGATIVE

## 2017-11-22 LAB — POCT URINE PREGNANCY: Preg Test, Ur: NEGATIVE

## 2017-11-22 SURGERY — HYSTERECTOMY, SUPRACERVICAL, LAPAROSCOPIC
Anesthesia: General

## 2017-11-22 MED ORDER — DEXAMETHASONE SODIUM PHOSPHATE 10 MG/ML IJ SOLN
INTRAMUSCULAR | Status: DC | PRN
  Administered 2017-11-22: 6 mg via INTRAVENOUS

## 2017-11-22 MED ORDER — MIDAZOLAM HCL 2 MG/2ML IJ SOLN
INTRAMUSCULAR | Status: AC
  Filled 2017-11-22: qty 2

## 2017-11-22 MED ORDER — LACTATED RINGERS IV SOLN
INTRAVENOUS | Status: DC
  Administered 2017-11-22: 12:00:00 via INTRAVENOUS

## 2017-11-22 MED ORDER — BUPIVACAINE HCL (PF) 0.5 % IJ SOLN
INTRAMUSCULAR | Status: DC | PRN
  Administered 2017-11-22: 10 mL

## 2017-11-22 MED ORDER — PROPOFOL 10 MG/ML IV BOLUS
INTRAVENOUS | Status: DC | PRN
  Administered 2017-11-22: 140 mg via INTRAVENOUS

## 2017-11-22 MED ORDER — MIDAZOLAM HCL 2 MG/2ML IJ SOLN
INTRAMUSCULAR | Status: DC | PRN
  Administered 2017-11-22: 2 mg via INTRAVENOUS

## 2017-11-22 MED ORDER — SUGAMMADEX SODIUM 200 MG/2ML IV SOLN
INTRAVENOUS | Status: DC | PRN
  Administered 2017-11-22: 140 mg via INTRAVENOUS

## 2017-11-22 MED ORDER — ONDANSETRON HCL 4 MG/2ML IJ SOLN
INTRAMUSCULAR | Status: DC | PRN
  Administered 2017-11-22: 4 mg via INTRAVENOUS

## 2017-11-22 MED ORDER — ONDANSETRON HCL 4 MG/2ML IJ SOLN
INTRAMUSCULAR | Status: AC
  Filled 2017-11-22: qty 2

## 2017-11-22 MED ORDER — CEFAZOLIN SODIUM-DEXTROSE 2-4 GM/100ML-% IV SOLN
2.0000 g | Freq: Once | INTRAVENOUS | Status: AC
  Administered 2017-11-22: 2 g via INTRAVENOUS

## 2017-11-22 MED ORDER — FAMOTIDINE 20 MG PO TABS
20.0000 mg | ORAL_TABLET | Freq: Once | ORAL | Status: AC
  Administered 2017-11-22: 20 mg via ORAL

## 2017-11-22 MED ORDER — FENTANYL CITRATE (PF) 250 MCG/5ML IJ SOLN
INTRAMUSCULAR | Status: AC
  Filled 2017-11-22: qty 5

## 2017-11-22 MED ORDER — BUPIVACAINE HCL (PF) 0.5 % IJ SOLN
INTRAMUSCULAR | Status: AC
  Filled 2017-11-22: qty 30

## 2017-11-22 MED ORDER — SOD CITRATE-CITRIC ACID 500-334 MG/5ML PO SOLN
30.0000 mL | ORAL | Status: DC
  Filled 2017-11-22: qty 30

## 2017-11-22 MED ORDER — KETOROLAC TROMETHAMINE 30 MG/ML IJ SOLN
30.0000 mg | Freq: Four times a day (QID) | INTRAMUSCULAR | Status: DC
  Administered 2017-11-22 – 2017-11-23 (×3): 30 mg via INTRAVENOUS
  Filled 2017-11-22 (×4): qty 1

## 2017-11-22 MED ORDER — FENTANYL CITRATE (PF) 100 MCG/2ML IJ SOLN
25.0000 ug | INTRAMUSCULAR | Status: DC | PRN
  Administered 2017-11-22 (×2): 25 ug via INTRAVENOUS

## 2017-11-22 MED ORDER — ROCURONIUM BROMIDE 50 MG/5ML IV SOLN
INTRAVENOUS | Status: AC
  Filled 2017-11-22: qty 1

## 2017-11-22 MED ORDER — MEPERIDINE HCL 50 MG/ML IJ SOLN
50.0000 mg | INTRAMUSCULAR | Status: DC | PRN
  Administered 2017-11-22 – 2017-11-23 (×2): 50 mg via INTRAVENOUS
  Filled 2017-11-22 (×2): qty 1

## 2017-11-22 MED ORDER — DEXAMETHASONE SODIUM PHOSPHATE 10 MG/ML IJ SOLN
INTRAMUSCULAR | Status: AC
  Filled 2017-11-22: qty 1

## 2017-11-22 MED ORDER — CEFAZOLIN SODIUM-DEXTROSE 2-4 GM/100ML-% IV SOLN
INTRAVENOUS | Status: AC
  Filled 2017-11-22: qty 100

## 2017-11-22 MED ORDER — FENTANYL CITRATE (PF) 100 MCG/2ML IJ SOLN
INTRAMUSCULAR | Status: AC
  Administered 2017-11-22: 25 ug via INTRAVENOUS
  Filled 2017-11-22: qty 2

## 2017-11-22 MED ORDER — ACETAMINOPHEN 10 MG/ML IV SOLN
INTRAVENOUS | Status: DC | PRN
  Administered 2017-11-22: 1000 mg via INTRAVENOUS

## 2017-11-22 MED ORDER — FENTANYL CITRATE (PF) 100 MCG/2ML IJ SOLN
INTRAMUSCULAR | Status: DC | PRN
  Administered 2017-11-22 (×2): 50 ug via INTRAVENOUS
  Administered 2017-11-22: 100 ug via INTRAVENOUS
  Administered 2017-11-22: 50 ug via INTRAVENOUS

## 2017-11-22 MED ORDER — LIDOCAINE HCL (CARDIAC) PF 100 MG/5ML IV SOSY
PREFILLED_SYRINGE | INTRAVENOUS | Status: DC | PRN
  Administered 2017-11-22: 100 mg via INTRAVENOUS

## 2017-11-22 MED ORDER — ROCURONIUM BROMIDE 100 MG/10ML IV SOLN
INTRAVENOUS | Status: DC | PRN
  Administered 2017-11-22: 10 mg via INTRAVENOUS
  Administered 2017-11-22: 40 mg via INTRAVENOUS
  Administered 2017-11-22: 10 mg via INTRAVENOUS

## 2017-11-22 MED ORDER — LACTATED RINGERS IV SOLN
INTRAVENOUS | Status: DC
  Administered 2017-11-22: 18:00:00 via INTRAVENOUS

## 2017-11-22 MED ORDER — PROMETHAZINE HCL 25 MG/ML IJ SOLN: 6.2500 mg | INTRAMUSCULAR | Status: DC | PRN

## 2017-11-22 MED ORDER — ONDANSETRON HCL 4 MG/2ML IJ SOLN: 4.0000 mg | Freq: Four times a day (QID) | INTRAMUSCULAR | Status: DC | PRN

## 2017-11-22 MED ORDER — LACTATED RINGERS IV SOLN
INTRAVENOUS | Status: DC
  Administered 2017-11-22 – 2017-11-23 (×3): via INTRAVENOUS

## 2017-11-22 MED ORDER — PROPOFOL 10 MG/ML IV BOLUS
INTRAVENOUS | Status: AC
  Filled 2017-11-22: qty 20

## 2017-11-22 MED ORDER — KETOROLAC TROMETHAMINE 30 MG/ML IJ SOLN
INTRAMUSCULAR | Status: AC
  Filled 2017-11-22: qty 1

## 2017-11-22 MED ORDER — LIDOCAINE HCL (PF) 2 % IJ SOLN
INTRAMUSCULAR | Status: AC
  Filled 2017-11-22: qty 10

## 2017-11-22 MED ORDER — ACETAMINOPHEN 10 MG/ML IV SOLN
INTRAVENOUS | Status: AC
  Filled 2017-11-22: qty 100

## 2017-11-22 MED ORDER — ONDANSETRON HCL 4 MG PO TABS: 4.0000 mg | ORAL_TABLET | Freq: Four times a day (QID) | ORAL | Status: DC | PRN

## 2017-11-22 MED ORDER — KETOROLAC TROMETHAMINE 30 MG/ML IJ SOLN
INTRAMUSCULAR | Status: DC | PRN
  Administered 2017-11-22: 30 mg via INTRAVENOUS

## 2017-11-22 SURGICAL SUPPLY — 45 items
BAG URINE DRAINAGE (UROLOGICAL SUPPLIES) ×4 IMPLANT
BLADE SURG SZ11 CARB STEEL (BLADE) ×4 IMPLANT
CANISTER SUCT 1200ML W/VALVE (MISCELLANEOUS) ×4 IMPLANT
CATH FOLEY 2WAY  5CC 16FR (CATHETERS) ×2
CATH URTH 16FR FL 2W BLN LF (CATHETERS) ×2 IMPLANT
CHLORAPREP W/TINT 26ML (MISCELLANEOUS) ×8 IMPLANT
CLOSURE WOUND 1/2 X4 (GAUZE/BANDAGES/DRESSINGS) ×1
CLOSURE WOUND 1/4X4 (GAUZE/BANDAGES/DRESSINGS) ×1
COVER WAND RF STERILE (DRAPES) ×4 IMPLANT
DRSG TEGADERM 2-3/8X2-3/4 SM (GAUZE/BANDAGES/DRESSINGS) ×12 IMPLANT
GLOVE BIO SURGEON STRL SZ8 (GLOVE) ×12 IMPLANT
GOWN STRL REUS W/ TWL LRG LVL3 (GOWN DISPOSABLE) ×4 IMPLANT
GOWN STRL REUS W/ TWL XL LVL3 (GOWN DISPOSABLE) ×2 IMPLANT
GOWN STRL REUS W/TWL LRG LVL3 (GOWN DISPOSABLE) ×4
GOWN STRL REUS W/TWL XL LVL3 (GOWN DISPOSABLE) ×2
GRASPER SUT TROCAR 14GX15 (MISCELLANEOUS) ×4 IMPLANT
IRRIGATION STRYKERFLOW (MISCELLANEOUS) ×2 IMPLANT
IRRIGATOR STRYKERFLOW (MISCELLANEOUS) ×4
IV LACTATED RINGERS 1000ML (IV SOLUTION) ×8 IMPLANT
KIT PINK PAD W/HEAD ARE REST (MISCELLANEOUS) ×4
KIT PINK PAD W/HEAD ARM REST (MISCELLANEOUS) ×2 IMPLANT
KIT TURNOVER CYSTO (KITS) ×4 IMPLANT
LABEL OR SOLS (LABEL) ×4 IMPLANT
MORCELLATOR XCISE  COR (MISCELLANEOUS) ×2
MORCELLATOR XCISE COR (MISCELLANEOUS) ×2 IMPLANT
NS IRRIG 500ML POUR BTL (IV SOLUTION) ×4 IMPLANT
PACK GYN LAPAROSCOPIC (MISCELLANEOUS) ×4 IMPLANT
PAD OB MATERNITY 4.3X12.25 (PERSONAL CARE ITEMS) ×4 IMPLANT
PAD PREP 24X41 OB/GYN DISP (PERSONAL CARE ITEMS) ×4 IMPLANT
SET CYSTO W/LG BORE CLAMP LF (SET/KITS/TRAYS/PACK) ×4 IMPLANT
SHEARS HARMONIC ACE PLUS 36CM (ENDOMECHANICALS) ×4 IMPLANT
SOLUTION ELECTROLUBE (MISCELLANEOUS) ×4 IMPLANT
SPONGE GAUZE 2X2 8PLY STER LF (GAUZE/BANDAGES/DRESSINGS) ×2
SPONGE GAUZE 2X2 8PLY STRL LF (GAUZE/BANDAGES/DRESSINGS) ×6 IMPLANT
STRIP CLOSURE SKIN 1/2X4 (GAUZE/BANDAGES/DRESSINGS) ×3 IMPLANT
STRIP CLOSURE SKIN 1/4X4 (GAUZE/BANDAGES/DRESSINGS) ×3 IMPLANT
SUT VIC AB 0 CT1 36 (SUTURE) ×4 IMPLANT
SUT VIC AB 2-0 UR6 27 (SUTURE) ×4 IMPLANT
SUT VIC AB 4-0 SH 27 (SUTURE) ×4
SUT VIC AB 4-0 SH 27XANBCTRL (SUTURE) ×4 IMPLANT
SYR 10ML LL (SYRINGE) ×4 IMPLANT
TROCAR ENDO BLADELESS 11MM (ENDOMECHANICALS) ×4 IMPLANT
TROCAR XCEL NON-BLD 5MMX100MML (ENDOMECHANICALS) ×4 IMPLANT
TROCAR XCEL UNIV SLVE 11M 100M (ENDOMECHANICALS) ×4 IMPLANT
TUBING INSUFFLATION (TUBING) ×4 IMPLANT

## 2017-11-22 NOTE — Progress Notes (Signed)
Patient complains of need to urinate. Explained she has urinary catheter in place. Emptied 278ml upon arrival to PACU. 66ml in bag at this time. Reports no pain other than discomfort from urge to urinate. Offered pain medication but patient refused.

## 2017-11-22 NOTE — Transfer of Care (Signed)
Immediate Anesthesia Transfer of Care Note  Patient: Wendy Russell  Procedure(s) Performed: LAPAROSCOPIC SUPRACERVICAL HYSTERECTOMY (N/A ) LAPAROSCOPIC BILATERAL SALPINGECTOMY (Bilateral )  Patient Location: PACU  Anesthesia Type:General  Level of Consciousness: sedated  Airway & Oxygen Therapy: Patient Spontanous Breathing  Post-op Assessment: Report given to RN and Post -op Vital signs reviewed and stable  Post vital signs: Reviewed and stable  Last Vitals:  Vitals Value Taken Time  BP 109/63 11/22/2017  5:31 PM  Temp 36.4 C 11/22/2017  5:31 PM  Pulse 75 11/22/2017  5:32 PM  Resp 26 11/22/2017  5:32 PM  SpO2 98 % 11/22/2017  5:32 PM  Vitals shown include unvalidated device data.  Last Pain:  Vitals:   11/22/17 1157  TempSrc: Tympanic         Complications: No apparent anesthesia complications

## 2017-11-22 NOTE — Progress Notes (Signed)
Ready for surgery . [place] and bilat salpingectomy . All questions answered

## 2017-11-22 NOTE — Op Note (Signed)
NAME: Wendy Russell, Wendy Russell MEDICAL RECORD DX:[id number] ACCOUNT [id number] DATE OF BIRTH:09/05/1970 FACILITY: [facility] LOCATION: [facility] PHYSICIAN:Avaleen Brownley Josefine Class, MD  OPERATIVE REPORT  DATE OF PROCEDURE:  11/22/2017  PREOPERATIVE DIAGNOSES: 1.  Hematometra. 2.  Acute pelvic pain.  POSTOPERATIVE DIAGNOSES: 1.  Hematometra. 2.  Acute pelvic pain. 3.  Pelvic adhesive disease.  PROCEDURE: 1.  Laparoscopic supracervical hysterectomy. 2.  Bilateral salpingectomy. 3.  Lysis of adhesions. 4.  Cystoscopy. 5.  Bilateral ovarian cystotomies.  SURGEON:  Laverta Baltimore, MD  FIRST ASSISTANT:  Larey Days, MD  INDICATIONS:  A 47 year old female 4 months status post a NovaSure endometrial ablation with amenorrhea after the procedure.  The patient developed acute onset of left lower quadrant pain 1 week prior to surgery.  The patient was seen in the Emergency  Department and underwent a CT scan and a pelvic ultrasound that yielded only a thickened endometrial cavity.  The patient had a repeat ultrasound the day before the procedure that showed the endometrial stripe moving from 18 mm to 30 mm.  She was also  noted to have bilateral ovarian cysts.  Attempt at cervical dilation and release of presumed hematometra was unsuccessful in the office.  The patient was given the option of an attempt to dilate the cervix in the operating room versus a laparoscopic  supracervical hysterectomy.  The patient opted for the latter.  DESCRIPTION OF PROCEDURE:  After adequate general endotracheal anesthesia, the patient was placed in dorsal supine position, legs in the [place] stirrups.  The patient did receive 2 g IV Ancef prior to commencement of the case.  The patient was prepped and  draped in normal sterile fashion.  Timeout was performed.  Straight catheterization of the bladder with Foley catheter yielded clear urine.  Foley was kept in for the laparoscopic portion of the procedure.  A single-tooth  tenaculum was applied on the  anterior cervix, and uterine sound was placed into the endocervix but could not be passed into the endometrial cavity.  These 2 were tethered together with Steri-Strips to be used for uterine manipulation during the procedure.  Gloves were changed.   Attention was directed to the patient's umbilicus where a 5 mm incision was made after injecting with 0.5% Marcaine.  The 5 mm scope was advanced into the abdominal cavity under direct visualization with the Optiview cannula.  Once gaining entrance into  the abdominal cavity, the patient's abdomen was insufflated with carbon dioxide.  Second port site, a 12 mm trocar was advanced into the left lower quadrant area 3 cm medial to the left anterior iliac spine under direct visualization.  A trocar was  advanced into the abdominal cavity.  A third port site was placed on the right side, again 3 cm medial to the right anterior iliac spine.  A 5 mm port was advanced into the abdominal cavity.  Initial impression showed significant omental adhesions to the  anterior lower abdominal wall.  Significant adhesions of the uterus to the left lower sidewall.  Of note, the patient was status post 2 prior cesarean sections.  There were bilateral ovarian cysts that were noted as well.  The upper abdomen revealed a  small amount of omental tissue close to the liver edge.  Harmonic scalpel was brought up to the operative field.  The left fallopian tube was grasped at the fimbriated end, and the distal portion of fallopian tube was dissected.  It will be submitted  with permanent pathologic review.  Ovarian cysts were opened with  Harmonic scalpel with clear fluid resulting.  The round ligament was opened with Harmonic scalpel followed by dissecting the uteroovarian ligaments and the broad ligament to the level of  the left uterine artery.  Kleppinger cautery was used to cauterize the ascending portion of the uterine artery and then was transected.   Significant adhesiolysis was performed at the lower uterine segment to the lower pelvic area.  A bladder flap was  created with sharp and blunt dissection.  Attention was then directed to the patient's right side of the uterus, and the right fallopian tube was grasped with the fimbriated end and again dissected with Harmonic scalpel and removed, followed by the right  uteroovarian ligament and broad ligament dissection and the right uterine artery was skeletonized, cauterized and transected.  The uterus was then transected at the level of the uterosacral ligaments.  The uterine sound was removed, and the endocervical  canal was cauterized with the Kleppinger cautery.  Good hemostasis was noted.  The morcellator was brought up to the operative field, and the uterus was morcellated in the standard fashion.  Once gaining entrance into the endometrial cavity, a thick  chocolate mucoid fluid was identified, which was consistent with the hematometra.  The patient's abdomen was irrigated and pressure was lowered to 7 mmHg and good hemostasis was noted.  Given the amount of dissection at the left lower pelvic area and the  bladder area, cystoscopy was then performed using lactated Ringer's as distending medium.  A 30-degree cystoscope was advanced into the bladder.  Ureteral orifices were identified bilaterally, and there was a normal pluming effect seen with urine.  No  other defects noted in the bladder.  Cystoscope was removed, and the Foley was replaced.  The single-tooth tenaculum was removed.  The incisions were then closed with a fascial layer of the left lower quadrant port site, and then all skin incisions were  closed with interrupted 4-0 Vicryl suture.  Sterile dressing applied.  COMPLICATIONS:  There were no complications.  ESTIMATED BLOOD LOSS:  100 mL.  URINE OUTPUT:  300 mL.  INTRAOPERATIVE FLUIDS:  800 mL.  The patient did receive 1000 mg of intravenous Tylenol and 30 mg of Toradol at the  end of the case.  The patient tolerated the procedure well and was taken to recovery room in good condition.  LN/NUANCE  D:11/22/2017 T:11/22/2017 JOB:[id number]

## 2017-11-22 NOTE — Brief Op Note (Signed)
11/22/2017  5:13 PM  PATIENT:  Wendy Russell  47 y.o. female  PRE-OPERATIVE DIAGNOSIS:  HEMATOMETRA, PELVIC PAIN  POST-OPERATIVE DIAGNOSIS:  HEMATOMETRA, PELVIC PAIN  PROCEDURE:  Procedure(s): LAPAROSCOPIC SUPRACERVICAL HYSTERECTOMY (N/A) LAPAROSCOPIC BILATERAL SALPINGECTOMY (Bilateral) Lysis of adhesions SURGEON:  Surgeon(s) and Role:    * Gwyn Mehring, Gwen Her, MD - Primary    * Ward, Honor Loh, MD - Assisting  PHYSICIAN ASSISTANT:   ASSISTANTS: none   ANESTHESIA:   general  EBL:  100 mL   BLOOD ADMINISTERED:none  DRAINS: Urinary Catheter (Foley)   LOCAL MEDICATIONS USED:  MARCAINE     SPECIMEN:  Source of Specimen:  uterus, distal bilateral fallopian tubes  DISPOSITION OF SPECIMEN:  PATHOLOGY  COUNTS:  YES  TOURNIQUET:  * No tourniquets in log *  DICTATION: .Other Dictation: Dictation Number verbal  PLAN OF CARE: Admit for overnight observation  PATIENT DISPOSITION:  PACU - hemodynamically stable.   Delay start of Pharmacological VTE agent (>24hrs) due to surgical blood loss or risk of bleeding: not applicable

## 2017-11-22 NOTE — Anesthesia Postprocedure Evaluation (Signed)
Anesthesia Post Note  Patient: MARYBETH DANDY  Procedure(s) Performed: LAPAROSCOPIC SUPRACERVICAL HYSTERECTOMY (N/A ) LAPAROSCOPIC BILATERAL SALPINGECTOMY (Bilateral )  Patient location during evaluation: PACU Anesthesia Type: General Level of consciousness: awake and alert Pain management: pain level controlled Vital Signs Assessment: post-procedure vital signs reviewed and stable Respiratory status: spontaneous breathing, nonlabored ventilation, respiratory function stable and patient connected to nasal cannula oxygen Cardiovascular status: blood pressure returned to baseline and stable Postop Assessment: no apparent nausea or vomiting Anesthetic complications: no     Last Vitals:  Vitals:   11/22/17 1820 11/22/17 1831  BP:  140/72  Pulse: 71 74  Resp: 17 18  Temp:  36.4 C  SpO2: 100% 96%    Last Pain:  Vitals:   11/22/17 1840  TempSrc:   PainSc: 3                  Precious Haws Pennye Beeghly

## 2017-11-22 NOTE — Anesthesia Post-op Follow-up Note (Signed)
Anesthesia QCDR form completed.        

## 2017-11-22 NOTE — Anesthesia Procedure Notes (Signed)

## 2017-11-22 NOTE — Anesthesia Preprocedure Evaluation (Addendum)
Anesthesia Evaluation  Patient identified by MRN, date of birth, ID band Patient awake    Reviewed: Allergy & Precautions, H&P , NPO status , Patient's Chart, lab work & pertinent test results  History of Anesthesia Complications Negative for: history of anesthetic complications  Airway Mallampati: III   Neck ROM: full    Dental  (+) Teeth Intact, Poor Dentition   Pulmonary neg pulmonary ROS, Current Smoker,    breath sounds clear to auscultation       Cardiovascular hypertension, negative cardio ROS   Rhythm:regular Rate:Normal     Neuro/Psych  Headaches, Anxiety negative neurological ROS  negative psych ROS   GI/Hepatic Neg liver ROS, GERD  ,  Endo/Other  negative endocrine ROS  Renal/GU      Musculoskeletal   Abdominal   Peds  Hematology negative hematology ROS (+)   Anesthesia Other Findings Past Medical History: No date: Anemia     Comment:  H/O No date: Anxiety No date: GERD (gastroesophageal reflux disease)     Comment:  OCC-TUMS PRN No date: Hypertension     Comment:  H/O OFF MEDS SINCE 2018 DUE TO BP CONTROL No date: Migraines     Comment:  MIGRAINES  Past Surgical History: No date: CESAREAN SECTION     Comment:  X2 07/08/2017: DILITATION & CURRETTAGE/HYSTROSCOPY WITH NOVASURE ABLATION;  N/A     Comment:  Procedure: DILATATION & CURETTAGE/HYSTEROSCOPY WITH               NOVASURE ABLATION;  Surgeon: Schermerhorn, Gwen Her, MD;               Location: [facility];  Service: Gynecology;  Laterality:               N/A; 1993: TUBAL LIGATION No date: WISDOM TOOTH EXTRACTION  BMI    Body Mass Index:  31.71 kg/m      Reproductive/Obstetrics negative OB ROS                         Anesthesia Physical Anesthesia Plan  ASA: II  Anesthesia Plan: General ETT   Post-op Pain Management:    Induction:   PONV Risk Score and Plan: Ondansetron and Dexamethasone  Airway  Management Planned:   Additional Equipment:   Intra-op Plan:   Post-operative Plan:   Informed Consent: I have reviewed the patients History and Physical, chart, labs and discussed the procedure including the risks, benefits and alternatives for the proposed anesthesia with the patient or authorized representative who has indicated his/her understanding and acceptance.   Dental Advisory Given  Plan Discussed with: Anesthesiologist, CRNA and Surgeon  Anesthesia Plan Comments:        Anesthesia Quick Evaluation

## 2017-11-22 NOTE — Progress Notes (Signed)
Patient ID: Wendy Russell, female   DOB: Aug 29, 1970, 47 y.o.   MRN: [id number] Pt is 3 hours post op . Pain better . Only c/o ids with the foley catheter  urine out good VSS A: hemodynamically stable  P: d/c foley  Anticipate d/c in am

## 2017-11-23 ENCOUNTER — Encounter: Payer: Self-pay | Admitting: Obstetrics and Gynecology

## 2017-11-23 DIAGNOSIS — D251 Intramural leiomyoma of uterus: Secondary | ICD-10-CM | POA: Diagnosis not present

## 2017-11-23 MED ORDER — IBUPROFEN 800 MG PO TABS: 800.0000 mg | ORAL_TABLET | Freq: Three times a day (TID) | ORAL | 0 refills | Status: DC | PRN

## 2017-11-23 MED ORDER — MEPERIDINE HCL 50 MG PO TABS: 50.0000 mg | ORAL_TABLET | ORAL | 0 refills | Status: DC | PRN

## 2017-11-23 NOTE — Discharge Summary (Signed)
Physician Discharge Summary  Patient ID: Wendy Russell MRN: [id number] DOB/AGE: 03/11/1970 47 y.o.  Admit date: 11/22/2017 Discharge date: 11/23/2017  Admission Diagnoses:hematometra , acute pelvic pain  Discharge Diagnoses:  Active Problems:   Post-operative state   Discharged Condition: good  Hospital Course: underwent LSH and bilat salpingectomy and LOA  Cystoscopy and bilater ovarian cystotomies  Consults: None  Significant Diagnostic Studies: labs:   Treatments: surgery: as above  Discharge Exam: Blood pressure 106/62, pulse 77, temperature 97.9 F (36.6 C), temperature source Oral, resp. rate 18, height 4\' 11"  (1.499 m), weight 71.2 kg, SpO2 95 %. General appearance: alert and cooperative Resp: clear to auscultation bilaterally Cardio: regular rate and rhythm, S1, S2 normal, no murmur, click, rub or gallop GI: soft, non-tender; bowel sounds normal; no masses,  no organomegaly Incision/Wound:  Disposition: Discharge disposition: 01-Home or Self Care       Discharge Instructions    Call MD for:  difficulty breathing, headache or visual disturbances   Complete by:  As directed    Call MD for:  extreme fatigue   Complete by:  As directed    Call MD for:  hives   Complete by:  As directed    Call MD for:  persistant dizziness or light-headedness   Complete by:  As directed    Call MD for:  persistant nausea and vomiting   Complete by:  As directed    Call MD for:  redness, tenderness, or signs of infection (pain, swelling, redness, odor or green/yellow discharge around incision site)   Complete by:  As directed    Call MD for:  severe uncontrolled pain   Complete by:  As directed    Call MD for:  temperature >100.4   Complete by:  As directed    Diet - low sodium heart healthy   Complete by:  As directed    Increase activity slowly   Complete by:  As directed      Allergies as of 11/23/2017      Reactions   Codeine Anaphylaxis   Aspirin Other (See  Comments)   Nose bleeds   Doxycycline Nausea And Vomiting      Medication List    STOP taking these medications   calcium carbonate 500 MG chewable tablet Commonly known as:  TUMS - dosed in mg elemental calcium   diazepam 5 MG tablet Commonly known as:  VALIUM   nicotine 21 mg/24hr patch Commonly known as:  NICODERM CQ - dosed in mg/24 hours   SUMAtriptan 100 MG tablet Commonly known as:  IMITREX     TAKE these medications   ibuprofen 800 MG tablet Commonly known as:  ADVIL,MOTRIN Take 1 tablet (800 mg total) by mouth every 8 (eight) hours as needed for moderate pain. What changed:    medication strength  how much to take  when to take this  reasons to take this   meperidine 50 MG tablet Commonly known as:  DEMEROL Take 1 tablet (50 mg total) by mouth every 4 (four) hours as needed for moderate pain.   metaxalone 800 MG tablet Commonly known as:  SKELAXIN Take 800 mg by mouth 3 (three) times daily.   nortriptyline 25 MG capsule Commonly known as:  PAMELOR Take 1 capsule by mouth at bedtime.      Follow-up Information    Schermerhorn, Gwen Her, MD Follow up in 2 week(s).   Specialty:  Obstetrics and Gynecology Why:  post op Contact information: [place]  [address] [place] [address] [phone]           Signed: Gwen Her Schermerhorn 11/23/2017, 9:03 AM

## 2017-11-23 NOTE — Progress Notes (Signed)
Pt discharged home.  Discharge instructions, prescriptions and follow up appointment given to and reviewed with pt.  Pt verbalized understanding.  Pt ambulated out.

## 2017-11-23 NOTE — Plan of Care (Signed)
Vs stable; requested from MD to have foley removed this shift; foley out; pt has voided; pt has ambulated well in hallway; pt tolerating regular diet; taking IV toradol and occasionally Iv demeraol for pain control; wants to be discharged later today

## 2017-11-25 LAB — SURGICAL PATHOLOGY

## 2017-11-29 ENCOUNTER — Emergency Department
Admission: EM | Admit: 2017-11-29 | Discharge: 2017-11-29 | Disposition: A | Discharge status: Home / Self Care | Payer: Commercial Managed Care - PPO | Attending: Emergency Medicine | Admitting: Emergency Medicine

## 2017-11-29 ENCOUNTER — Encounter: Payer: Self-pay | Admitting: Emergency Medicine

## 2017-11-29 ENCOUNTER — Emergency Department: Payer: Commercial Managed Care - PPO

## 2017-11-29 DIAGNOSIS — F17210 Nicotine dependence, cigarettes, uncomplicated: Secondary | ICD-10-CM | POA: Diagnosis not present

## 2017-11-29 DIAGNOSIS — Z79899 Other long term (current) drug therapy: Secondary | ICD-10-CM | POA: Diagnosis not present

## 2017-11-29 DIAGNOSIS — G8918 Other acute postprocedural pain: Secondary | ICD-10-CM

## 2017-11-29 DIAGNOSIS — I10 Essential (primary) hypertension: Secondary | ICD-10-CM | POA: Insufficient documentation

## 2017-11-29 DIAGNOSIS — R1030 Lower abdominal pain, unspecified: Secondary | ICD-10-CM | POA: Diagnosis not present

## 2017-11-29 LAB — CBC
HCT: 40.7 % (ref 36.0–46.0)
HEMOGLOBIN: 14 g/dL (ref 12.0–15.0)
MCH: 35.2 pg — AB (ref 26.0–34.0)
MCHC: 34.4 g/dL (ref 30.0–36.0)
MCV: 102.3 fL — ABNORMAL HIGH (ref 80.0–100.0)
NRBC: 0 % (ref 0.0–0.2)
PLATELETS: 318 10*3/uL (ref 150–400)
RBC: 3.98 MIL/uL (ref 3.87–5.11)
RDW: 13 % (ref 11.5–15.5)
WBC: 8.2 10*3/uL (ref 4.0–10.5)

## 2017-11-29 LAB — COMPREHENSIVE METABOLIC PANEL
ALBUMIN: 4.1 g/dL (ref 3.5–5.0)
ALK PHOS: 52 U/L (ref 38–126)
ALT: 24 U/L (ref 0–44)
ANION GAP: 9 (ref 5–15)
AST: 23 U/L (ref 15–41)
BILIRUBIN TOTAL: 0.6 mg/dL (ref 0.3–1.2)
BUN: 12 mg/dL (ref 6–20)
CALCIUM: 9 mg/dL (ref 8.9–10.3)
CO2: 26 mmol/L (ref 22–32)
Chloride: 103 mmol/L (ref 98–111)
Creatinine, Ser: 0.74 mg/dL (ref 0.44–1.00)
GFR calc non Af Amer: >60 mL/min (ref >60)
Glucose, Bld: 110 mg/dL — ABNORMAL HIGH (ref 70–99)
POTASSIUM: 3.8 mmol/L (ref 3.5–5.1)
Sodium: 138 mmol/L (ref 135–145)
TOTAL PROTEIN: 7.7 g/dL (ref 6.5–8.1)

## 2017-11-29 LAB — LIPASE, BLOOD: Lipase: 46 U/L (ref 11–51)

## 2017-11-29 MED ORDER — IOPAMIDOL (ISOVUE-300) INJECTION 61%
100.0000 mL | Freq: Once | INTRAVENOUS | Status: AC | PRN
  Administered 2017-11-29: 100 mL via INTRAVENOUS

## 2017-11-29 NOTE — ED Provider Notes (Signed)
[facility] Emergency Department Provider Note   ____________________________________________   I have reviewed the triage vital signs and the nursing notes.   HISTORY  Chief Complaint Post-op Problem   History limited by: Not Limited   HPI Wendy Russell is a 47 y.o. female who presents to the emergency department today the request of her OB/GYN doctor for a CT scan.  Patient states that she had hysterectomy performed roughly 1 week ago for bleeding and pain.  She states that since that time she felt like her abdomen has continued to get more distended.  This has been accompanied by pain.  She has had slight clear discharge however denies any vaginal bleeding or other discharge.  States that she saw her OB/GYN doctor yesterday who did blood work and urine.  Based on those results he wanted her to be evaluated for CT scan.  Patient denies any fevers although at the time my exam states she started to feel slightly hot.   Per medical record review patient has a history of recent hysterectomy.  Past Medical History:  Diagnosis Date  . Anemia    H/O  . Anxiety   . GERD (gastroesophageal reflux disease)    OCC-TUMS PRN  . Hypertension    H/O OFF MEDS SINCE 2018 DUE TO BP CONTROL  . Migraines    MIGRAINES    Patient Active Problem List   Diagnosis Date Noted  . Post-operative state 11/22/2017    Past Surgical History:  Procedure Laterality Date  . [place] WITH NOVASURE ABLATION N/A 07/08/2017   Procedure: DILATATION & CURETTAGE/HYSTEROSCOPY WITH NOVASURE ABLATION;  Surgeon: Schermerhorn, Gwen Her, MD;  Location: [facility];  Service: Gynecology;  Laterality: N/A;  . LAPAROSCOPIC BILATERAL SALPINGECTOMY Bilateral 11/22/2017   Procedure: LAPAROSCOPIC BILATERAL SALPINGECTOMY;  Surgeon: Schermerhorn, Gwen Her, MD;  Location: [facility];  Service: Gynecology;  Laterality: Bilateral;  . LAPAROSCOPIC  SUPRACERVICAL HYSTERECTOMY N/A 11/22/2017   Procedure: LAPAROSCOPIC SUPRACERVICAL HYSTERECTOMY;  Surgeon: Schermerhorn, Gwen Her, MD;  Location: [facility];  Service: Gynecology;  Laterality: N/A;  . TUBAL LIGATION  1993  . WISDOM TOOTH EXTRACTION      Prior to Admission medications   Medication Sig Start Date End Date Taking? Authorizing Provider  ibuprofen (ADVIL,MOTRIN) 800 MG tablet Take 1 tablet (800 mg total) by mouth every 8 (eight) hours as needed for moderate pain. 11/23/17   Schermerhorn, Gwen Her, MD  meperidine (DEMEROL) 50 MG tablet Take 1 tablet (50 mg total) by mouth every 4 (four) hours as needed for moderate pain. 11/23/17 11/23/18  Schermerhorn, Gwen Her, MD  metaxalone (SKELAXIN) 800 MG tablet Take 800 mg by mouth 3 (three) times daily.    [provider]  nortriptyline (PAMELOR) 25 MG capsule Take 1 capsule by mouth at bedtime. 06/22/17   [provider]    Allergies Codeine; Aspirin; Doxycycline; and Tramadol  Family History  Problem Relation Age of Onset  . Breast cancer Mother 18    Social History Social History   Tobacco Use  . Smoking status: Current Every Day Smoker    Packs/day: 0.50    Years: 23.00    Pack years: 11.50    Types: Cigarettes  . Smokeless tobacco: Never Used  Substance Use Topics  . Alcohol use: Yes    Comment: RARE  . Drug use: No    Review of Systems Constitutional: No fever/chills Eyes: No visual changes. ENT: No sore  throat. Cardiovascular: Denies chest pain. Respiratory: Denies shortness of breath. Gastrointestinal: Positive for lower abdominal pain and distention. Genitourinary: Negative for dysuria. Musculoskeletal: Negative for back pain. Skin: Negative for rash. Neurological: Negative for headaches, focal weakness or numbness.  ____________________________________________   PHYSICAL EXAM:  VITAL SIGNS: ED Triage Vitals [11/29/17 1600]  Enc Vitals Group     BP (!) 167/68     Pulse Rate 90      Resp 18     Temp 98.1 F (36.7 C)     Temp Source Oral     SpO2 97 %     Weight 157 lb (71.2 kg)     Height 4\' 11"  (1.499 m)     Head Circumference      Peak Flow      Pain Score 6   Constitutional: Alert and oriented.  Eyes: Conjunctivae are normal.  ENT      Head: Normocephalic and atraumatic.      Nose: No congestion/rhinnorhea.      Mouth/Throat: Mucous membranes are moist.      Neck: No stridor. Hematological/Lymphatic/Immunilogical: No cervical lymphadenopathy. Cardiovascular: Normal rate, regular rhythm.  No murmurs, rubs, or gallops.  Respiratory: Normal respiratory effort without tachypnea nor retractions. Breath sounds are clear and equal bilaterally. No wheezes/rales/rhonchi. Gastrointestinal: Soft and tender to palpation in the lower abdomen Genitourinary: Deferred Musculoskeletal: Normal range of motion in all extremities. No lower extremity edema. Neurologic:  Normal speech and language. No gross focal neurologic deficits are appreciated.  Skin:  Skin is warm, dry and intact. No rash noted. Psychiatric: Mood and affect are normal. Speech and behavior are normal. Patient exhibits appropriate insight and judgment.  ____________________________________________    LABS (pertinent positives/negatives)  Lipase 46 CBC wbc 8.2, hgb 14.0, plt 318 CMP wnl except glu 110  ____________________________________________   EKG  None  ____________________________________________    RADIOLOGY  CT abd/pel S/p hysterectomy. No acute findings  ____________________________________________   PROCEDURES  Procedures  ____________________________________________   INITIAL IMPRESSION / ASSESSMENT AND PLAN / ED COURSE  Pertinent labs & imaging results that were available during my care of the patient were reviewed by me and considered in my medical decision making (see chart for details).   Patient presented to the emergency department today with continued pain and  concerns for distention of her lower abdomen after a hysterectomy.  Patient had CT scan performed which did not show any acute abnormalities.  Discussed this finding with the patient.  Given negative CT scan and blood work which is reassuring do feel patient is safe for further work-up as outpatient.    ____________________________________________   FINAL CLINICAL IMPRESSION(S) / ED DIAGNOSES  Final diagnoses:  Lower abdominal pain  Postoperative pain     Note: This dictation was prepared with Dragon dictation. Any transcriptional errors that result from this process are unintentional     Nance Pear, MD 11/29/17 2032

## 2017-11-29 NOTE — Discharge Instructions (Addendum)
Please seek medical attention for any high fevers, chest pain, shortness of breath, change in behavior, persistent vomiting, bloody stool or any other new or concerning symptoms.  

## 2017-11-29 NOTE — ED Triage Notes (Addendum)
PT had hysterectomy a week prior and pt was concerned over swelling of her abdomen. Pt was seen by PCP who stated he wanted her to come in for a CT scan. Pt also had blood work & urine drawn. Pt reports normal flatulence pattern and bowel movement every day since surgery

## 2018-02-23 DIAGNOSIS — R102 Pelvic and perineal pain: Secondary | ICD-10-CM | POA: Diagnosis not present

## 2018-02-23 DIAGNOSIS — R42 Dizziness and giddiness: Secondary | ICD-10-CM | POA: Diagnosis not present

## 2018-02-23 DIAGNOSIS — D7589 Other specified diseases of blood and blood-forming organs: Secondary | ICD-10-CM | POA: Diagnosis not present

## 2018-02-23 DIAGNOSIS — G43009 Migraine without aura, not intractable, without status migrainosus: Secondary | ICD-10-CM | POA: Diagnosis not present

## 2018-02-23 DIAGNOSIS — E538 Deficiency of other specified B group vitamins: Secondary | ICD-10-CM | POA: Diagnosis not present

## 2018-03-01 DIAGNOSIS — J0100 Acute maxillary sinusitis, unspecified: Secondary | ICD-10-CM | POA: Diagnosis not present

## 2018-03-01 DIAGNOSIS — R6889 Other general symptoms and signs: Secondary | ICD-10-CM | POA: Diagnosis not present

## 2018-03-24 ENCOUNTER — Ambulatory Visit: Payer: Self-pay | Admitting: Urology

## 2018-03-28 DIAGNOSIS — R51 Headache: Secondary | ICD-10-CM | POA: Diagnosis not present

## 2018-04-03 DIAGNOSIS — R519 Headache, unspecified: Secondary | ICD-10-CM | POA: Insufficient documentation

## 2018-04-07 ENCOUNTER — Ambulatory Visit: Payer: Commercial Managed Care - PPO | Admitting: Urology

## 2018-04-07 ENCOUNTER — Encounter: Payer: Self-pay | Admitting: Urology

## 2018-04-07 VITALS — BP 124/77 | HR 96 | Ht 59.0 in | Wt 162.9 lb

## 2018-04-07 DIAGNOSIS — R102 Pelvic and perineal pain: Secondary | ICD-10-CM | POA: Diagnosis not present

## 2018-04-07 LAB — URINALYSIS, COMPLETE
Bilirubin, UA: NEGATIVE
Glucose, UA: NEGATIVE
KETONES UA: NEGATIVE
LEUKOCYTES UA: NEGATIVE
NITRITE UA: NEGATIVE
PH UA: 5 (ref 5.0–7.5)
Protein, UA: NEGATIVE
Specific Gravity, UA: 1.015 (ref 1.005–1.030)
UUROB: 0.2 mg/dL (ref 0.2–1.0)

## 2018-04-07 MED ORDER — SOLIFENACIN SUCCINATE 5 MG PO TABS: 5.0000 mg | ORAL_TABLET | Freq: Every day | ORAL | 11 refills | Status: DC

## 2018-04-07 NOTE — Progress Notes (Signed)
04/07/2018 2:47 PM   Wendy Russell 10-Jul-1970 [id number]  Referring provider: Kirk Ruths, MD [facility] [place], [place] [address]  Chief Complaint  Patient presents with  . Pelvic Pain    HPI: Wendy Russell is a 48 yo female who underwent Hx Oct 2019. A foley was placed and caused bladder pain. She also had LLQ pain. She said she had pain on the left prior to the Hx but it was up a little higher on the left. She underwent CT scan of the abd and pelvis Nov 29, 2017 which was normal. She has no constipation. She voids with a good flow. She has frequency. She has nocturia. She has dyspareunia but that was before the surgery, too. She is on nortriptyline.   No NG risk.   Her UA is clear today. Cr 0.74.   Modifying factors: There are no other modifying factors  Associated signs and symptoms: There are no other associated signs and symptoms Aggravating and relieving factors: There are no other aggravating or relieving factors Severity: Moderate Duration: Persistent    PMH: Past Medical History:  Diagnosis Date  . Anemia    H/O  . Anxiety   . GERD (gastroesophageal reflux disease)    OCC-TUMS PRN  . Hypertension    H/O OFF MEDS SINCE 2018 DUE TO BP CONTROL  . Migraines    MIGRAINES    Surgical History: Past Surgical History:  Procedure Laterality Date  . [place] WITH NOVASURE ABLATION N/A 07/08/2017   Procedure: DILATATION & CURETTAGE/HYSTEROSCOPY WITH NOVASURE ABLATION;  Surgeon: Schermerhorn, Gwen Her, MD;  Location: [facility];  Service: Gynecology;  Laterality: N/A;  . LAPAROSCOPIC BILATERAL SALPINGECTOMY Bilateral 11/22/2017   Procedure: LAPAROSCOPIC BILATERAL SALPINGECTOMY;  Surgeon: Schermerhorn, Gwen Her, MD;  Location: [facility];  Service: Gynecology;  Laterality: Bilateral;  . LAPAROSCOPIC SUPRACERVICAL HYSTERECTOMY N/A 11/22/2017   Procedure: LAPAROSCOPIC  SUPRACERVICAL HYSTERECTOMY;  Surgeon: Schermerhorn, Gwen Her, MD;  Location: [facility];  Service: Gynecology;  Laterality: N/A;  . TUBAL LIGATION  1993  . WISDOM TOOTH EXTRACTION      Home Medications:  Allergies as of 04/07/2018      Reactions   Codeine Anaphylaxis   Aspirin Other (See Comments)   Nose bleeds   Doxycycline Nausea And Vomiting   Tramadol    Tingling in face      Medication List       Accurate as of April 07, 2018  2:47 PM. Always use your most recent med list.        ibuprofen 800 MG tablet Commonly known as:  ADVIL,MOTRIN Take 1 tablet (800 mg total) by mouth every 8 (eight) hours as needed for moderate pain.   meperidine 50 MG tablet Commonly known as:  Demerol Take 1 tablet (50 mg total) by mouth every 4 (four) hours as needed for moderate pain.   metaxalone 800 MG tablet Commonly known as:  SKELAXIN Take 800 mg by mouth 3 (three) times daily.   nortriptyline 25 MG capsule Commonly known as:  PAMELOR Take 1 capsule by mouth at bedtime.       Allergies:  Allergies  Allergen Reactions  . Codeine Anaphylaxis  . Aspirin Other (See Comments)    Nose bleeds  . Doxycycline Nausea And Vomiting  . Tramadol     Tingling in face    Family History: Family History  Problem Relation Age of  Onset  . Breast cancer Mother 41    Social History:  reports that she has been smoking cigarettes. She has a 11.50 pack-year smoking history. She has never used smokeless tobacco. She reports current alcohol use. She reports that she does not use drugs.  ROS:                                        Physical Exam: LMP 06/20/2017 Comment: upreg neg  Constitutional:  Alert and oriented, No acute distress. HEENT: [place] AT, moist mucus membranes.  Trachea midline, no masses. Cardiovascular: No clubbing, cyanosis, or edema. Respiratory: Normal respiratory effort, no increased work of breathing. GI: Abdomen is soft, nontender, nondistended, no  abdominal masses GU: No CVA tenderness Lymph: No cervical or inguinal lymphadenopathy. Skin: No rashes, bruises or suspicious lesions. Neurologic: Grossly intact, no focal deficits, moving all 4 extremities. Psychiatric: Normal mood and affect.  Laboratory Data: Lab Results  Component Value Date   WBC 8.2 11/29/2017   HGB 14.0 11/29/2017   HCT 40.7 11/29/2017   MCV 102.3 (H) 11/29/2017   PLT 318 11/29/2017    Lab Results  Component Value Date   CREATININE 0.74 11/29/2017    No results found for: PSA  No results found for: TESTOSTERONE  No results found for: HGBA1C  Urinalysis    Component Value Date/Time   COLORURINE YELLOW (A) 11/15/2017 0815   APPEARANCEUR CLEAR (A) 11/15/2017 0815   LABSPEC 1.015 11/15/2017 0815   PHURINE 5.0 11/15/2017 0815   GLUCOSEU NEGATIVE 11/15/2017 0815   HGBUR SMALL (A) 11/15/2017 0815   BILIRUBINUR NEGATIVE 11/15/2017 0815   KETONESUR NEGATIVE 11/15/2017 0815   PROTEINUR NEGATIVE 11/15/2017 0815   NITRITE NEGATIVE 11/15/2017 0815   LEUKOCYTESUR NEGATIVE 11/15/2017 0815    Lab Results  Component Value Date   BACTERIA RARE (A) 11/15/2017    Pertinent Imaging: CT scan  No results found for this or any previous visit. No results found for this or any previous visit. No results found for this or any previous visit. No results found for this or any previous visit. No results found for this or any previous visit. No results found for this or any previous visit. No results found for this or any previous visit. Results for orders placed during the hospital encounter of 11/15/17  CT Renal Stone Study   Narrative CLINICAL DATA:  Left lower quadrant abdominal pain since 4 o'clock this morning. Evaluate for nephrolithiasis.  EXAM: CT ABDOMEN AND PELVIS WITHOUT CONTRAST  TECHNIQUE: Multidetector CT imaging of the abdomen and pelvis was performed following the standard protocol without IV contrast.  COMPARISON:   None.  FINDINGS: The lack of intravenous contrast limits the ability to evaluate solid abdominal organs.  Lower chest: Limited visualization lower thorax demonstrates minimal subsegmental atelectasis within the imaged bilateral lung bases. Note is made of a approximately 0.6 cm subpleural nodule within the left lower lobe (image 20, series 2). No pleural effusion.  Normal heart size.  No pericardial effusion.  Hepatobiliary: Normal hepatic contour. There are several peripherally calcified stones otherwise normal-appearing gallbladder. Dominant stone located within the neck of an measures approximately 1.4 x 1.2 cm (image 28, series 2). No definitive gallbladder wall thickening or pericholecystic fluid on this noncontrast examination. No ascites.  Pancreas: Normal noncontrast appearance of the pancreas  Spleen: Normal noncontrast appearance the spleen  Adrenals/Urinary Tract: There is an approximately 0.8  cm hypoattenuating lesion within the interpolar aspect the left kidney (image 33, series 2), too small to adequately characterize though likely representative of a renal cyst. Punctate (sub 1 mm) renal stones are suspected bilaterally (representative coronal images 78, 81 and 93, series 5). No renal stones are seen along expected course of either ureter or the urinary bladder. Punctate phleboliths seen within the right hemipelvis. Urinary bladder is decompressed. No urine obstruction or perinephric stranding.  Normal noncontrast appearance the bilateral adrenal glands.  Stomach/Bowel: Bowel is normal in course and caliber without wall thickening or evidence of enteric obstruction. Normal noncontrast appearance of the terminal ileum and retrocecal appendix. No pneumoperitoneum, pneumatosis or portal venous gas.  Vascular/Lymphatic: Minimal amount of calcified atherosclerotic plaque with a normal caliber abdominal aorta.  No bulky retroperitoneal, mesenteric, pelvic or  inguinal lymphadenopathy on this noncontrast examination.  Reproductive: Note is made of 2 hypoattenuating left-sided presumably physiologic adnexal cysts with dominant presumed cyst measuring approximately 2.4 x 1.7 cm (axial image 68, series 2) additional presumed cyst measuring approximately 1.7 x 1.3 cm (coronal image 68, series 5). No discrete right-sided adnexal lesion. Small amount of fluid is seen within the endometrial canal. No free fluid the pelvic cul-de-sac.  Other: Regional soft tissues are normal.  Musculoskeletal: No acute or aggressive osseous abnormalities mild degenerative change of the pubic symphysis.  IMPRESSION: 1. Note is made of two left-sided presumably physiologic adnexal cysts - Further evaluation could be performed pelvic ultrasound as clinically indicated. Otherwise, no explanation patient's left lower quadrant abdominal pain. Specifically, no evidence of enteric or urinary obstruction. Normal noncontrast appearance of the appendix. 2. Cholelithiasis without evidence of cholecystitis on this noncontrast examination. 3. Suspected punctate (sub 1 mm) bilateral nephrolithiasis. 4. Note is made of an approximately 0.6 cm left lower lobe pulmonary nodule. Non-contrast chest CT at 6-12 months is recommended. If the nodule is stable at time of repeat CT, then future CT at 18-24 months (from today's scan) is considered optional for low-risk patients, but is recommended for high-risk patients. This recommendation follows the consensus statement: Guidelines for Management of Incidental Pulmonary Nodules Detected on CT Images: From the Fleischner Society 2017; Radiology 2017; 284:228-243.   Electronically Signed   By: Sandi Mariscal M.D.   On: 11/15/2017 09:18     Assessment & Plan:    1. Pelvic pain in female I recommend pelvic floor PT given her chronic symptoms and pain with sex. CT was normal. UA clear. She's had pain since "last January" - of 2019. I  recommended she return for exam and cystoscopy.   2. Frequency, urgency - discussed the nature r/b of anticholinergics and I sent a rx for solifenacin.   - Urinalysis, Complete   No follow-ups on file.  Festus Aloe, MD  [facility] [address], [place], [place] [address] [phone]

## 2018-05-24 ENCOUNTER — Other Ambulatory Visit: Payer: Commercial Managed Care - PPO | Admitting: Urology

## 2018-07-11 ENCOUNTER — Telehealth: Payer: Self-pay | Admitting: Oncology

## 2018-07-11 NOTE — Progress Notes (Signed)
I connected with Wendy Russell on 07/11/18 at 11:15 AM EDT by video enabled telemedicine visit and verified that I am speaking with the correct person using two identifiers.   I discussed the limitations, risks, security and privacy concerns of performing an evaluation and management service by telemedicine and the availability of in-person appointments. I also discussed with the patient that there may be a patient responsible charge related to this service. The patient expressed understanding and agreed to proceed.  Other persons participating in the visit and their role in the encounter:  none  Patient's location:  home Provider's location:  home  Reason for referral: Macrocytosis Referring provider Dr. Ouida Sills   History of present illness:  Patient is a 48 year old female with a past medical history significant for Anxiety, migraine among other medical problems.  She recently had labs drawn on 06/28/2018 which showed a white count of 7.2, H&H of 14.3/41.1 with an MCV of 103 and a platelet count of 237.  Of note patient has had macrocytosis with an MCV ranging from 101 203 since October 2019.  Folate levels were normal in January 2020.  Thyroid levels were normal at that time.  B12 levels were low at 181 in January 2020 but normalized to 464 in May 2020.  Last CMP from January 2020 was within normal limits. She has remote h/o alcohol intake but currently states she has a glass of wine 2-3 times ayear. No known liver disease  She has h/o migraines and was on nortryptiline. It made her dizzy and now she is coming off the medication. Denies any OTC meds or herbal meds. No recent changes in her medications   Review of Systems  Constitutional: Negative for chills, fever, malaise/fatigue and weight loss.  HENT: Negative for congestion, ear discharge and nosebleeds.   Eyes: Negative for blurred vision.  Respiratory: Negative for cough, hemoptysis, sputum production, shortness of breath and wheezing.    Cardiovascular: Negative for chest pain, palpitations, orthopnea and claudication.  Gastrointestinal: Negative for abdominal pain, blood in stool, constipation, diarrhea, heartburn, melena, nausea and vomiting.  Genitourinary: Negative for dysuria, flank pain, frequency, hematuria and urgency.  Musculoskeletal: Negative for back pain, joint pain and myalgias.  Skin: Negative for rash.  Neurological: Negative for dizziness, tingling, focal weakness, seizures, weakness and headaches.       Difficulty with concentration  Endo/Heme/Allergies: Does not bruise/bleed easily.  Psychiatric/Behavioral: Negative for depression and suicidal ideas. The patient does not have insomnia.      Allergies  Allergen Reactions  . Codeine Anaphylaxis  . Aspirin Other (See Comments)    Nose bleeds  . Doxycycline Nausea And Vomiting  . Tramadol     Tingling in face    Past Medical History:  Diagnosis Date  . Anemia    H/O  . Anxiety   . GERD (gastroesophageal reflux disease)    OCC-TUMS PRN  . Hypertension    H/O OFF MEDS SINCE 2018 DUE TO BP CONTROL  . Migraines    MIGRAINES    Past Surgical History:  Procedure Laterality Date  . [place] WITH NOVASURE ABLATION N/A 07/08/2017   Procedure: DILATATION & CURETTAGE/HYSTEROSCOPY WITH NOVASURE ABLATION;  Surgeon: Schermerhorn, Gwen Her, MD;  Location: [facility];  Service: Gynecology;  Laterality: N/A;  . LAPAROSCOPIC BILATERAL SALPINGECTOMY Bilateral 11/22/2017   Procedure: LAPAROSCOPIC BILATERAL SALPINGECTOMY;  Surgeon: Schermerhorn, Gwen Her, MD;  Location: [facility];  Service: Gynecology;  Laterality: Bilateral;  .  LAPAROSCOPIC SUPRACERVICAL HYSTERECTOMY N/A 11/22/2017   Procedure: LAPAROSCOPIC SUPRACERVICAL HYSTERECTOMY;  Surgeon: Schermerhorn, Gwen Her, MD;  Location: [facility];  Service: Gynecology;  Laterality: N/A;  . TUBAL LIGATION  1993  . WISDOM TOOTH EXTRACTION      Social History    Socioeconomic History  . Marital status: Married    Spouse name: Not on file  . Number of children: Not on file  . Years of education: Not on file  . Highest education level: Not on file  Occupational History  . Not on file  Social Needs  . Financial resource strain: Not on file  . Food insecurity:    Worry: Not on file    Inability: Not on file  . Transportation needs:    Medical: Not on file    Non-medical: Not on file  Tobacco Use  . Smoking status: Current Every Day Smoker    Packs/day: 0.50    Years: 23.00    Pack years: 11.50    Types: Cigarettes  . Smokeless tobacco: Never Used  Substance and Sexual Activity  . Alcohol use: Yes    Comment: RARE  . Drug use: No  . Sexual activity: Yes  Lifestyle  . Physical activity:    Days per week: Not on file    Minutes per session: Not on file  . Stress: Not on file  Relationships  . Social connections:    Talks on phone: Not on file    Gets together: Not on file    Attends religious service: Not on file    Active member of club or organization: Not on file    Attends meetings of clubs or organizations: Not on file    Relationship status: Not on file  . Intimate partner violence:    Fear of current or ex partner: Not on file    Emotionally abused: Not on file    Physically abused: Not on file    Forced sexual activity: Not on file  Other Topics Concern  . Not on file  Social History Narrative  . Not on file    Family History  Problem Relation Age of Onset  . Breast cancer Mother 64     Current Outpatient Medications:  .  ibuprofen (ADVIL,MOTRIN) 800 MG tablet, Take 1 tablet (800 mg total) by mouth every 8 (eight) hours as needed for moderate pain., Disp: 30 tablet, Rfl: 0 .  meperidine (DEMEROL) 50 MG tablet, Take 1 tablet (50 mg total) by mouth every 4 (four) hours as needed for moderate pain., Disp: 25 tablet, Rfl: 0 .  metaxalone (SKELAXIN) 800 MG tablet, Take 800 mg by mouth 3 (three) times daily., Disp:  , Rfl:  .  nortriptyline (PAMELOR) 25 MG capsule, Take 1 capsule by mouth at bedtime., Disp: , Rfl: 3 .  solifenacin (VESICARE) 5 MG tablet, Take 1 tablet (5 mg total) by mouth daily., Disp: 30 tablet, Rfl: 11  No results found.  No images are attached to the encounter.   CMP Latest Ref Rng & Units 11/29/2017  Glucose 70 - 99 mg/dL 110(H)  BUN 6 - 20 mg/dL 12  Creatinine 0.44 - 1.00 mg/dL 0.74  Sodium 135 - 145 mmol/L 138  Potassium 3.5 - 5.1 mmol/L 3.8  Chloride 98 - 111 mmol/L 103  CO2 22 - 32 mmol/L 26  Calcium 8.9 - 10.3 mg/dL 9.0  Total Protein 6.5 - 8.1 g/dL 7.7  Total Bilirubin 0.3 - 1.2 mg/dL 0.6  Alkaline Phos 38 -  126 U/L 52  AST 15 - 41 U/L 23  ALT 0 - 44 U/L 24   CBC Latest Ref Rng & Units 11/29/2017  WBC 4.0 - 10.5 K/uL 8.2  Hemoglobin 12.0 - 15.0 g/dL 14.0  Hematocrit 36.0 - 46.0 % 40.7  Platelets 150 - 400 K/uL 318     Observation/objective: Appears in no acute distress of a video visit today.  Breathing is nonlabored  Assessment and plan: Patient is a 48 year old female referred for macrocytosis 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  I will plan to check a CBC with differential, smear review, myeloma panel, serum free light chains, reticulocyte count at this time.    Follow-up instructions:Video visit with MD in 2 weeks time  I discussed the assessment and treatment plan with the patient. The patient was provided an opportunity to ask questions and all were answered. The patient agreed with the plan and demonstrated an understanding of the instructions.   The patient was advised to call back or seek an in-person evaluation if the symptoms worsen or if the condition fails to improve as anticipated.    Visit Diagnosis: 1. Macrocytosis 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     Dr. Randa Evens, MD, MPH [facility] at [facility] [phone] 07/11/2018 3:27 PM

## 2018-07-12 ENCOUNTER — Other Ambulatory Visit: Payer: Self-pay

## 2018-07-12 ENCOUNTER — Inpatient Hospital Stay: Payer: Commercial Managed Care - PPO | Attending: Oncology | Admitting: Oncology

## 2018-07-12 ENCOUNTER — Inpatient Hospital Stay: Payer: Commercial Managed Care - PPO

## 2018-07-12 ENCOUNTER — Encounter: Payer: Self-pay | Admitting: Oncology

## 2018-07-12 DIAGNOSIS — D7589 Other specified diseases of blood and blood-forming organs: Secondary | ICD-10-CM | POA: Diagnosis not present

## 2018-07-12 DIAGNOSIS — F17210 Nicotine dependence, cigarettes, uncomplicated: Secondary | ICD-10-CM | POA: Diagnosis not present

## 2018-07-12 LAB — CBC WITH DIFFERENTIAL/PLATELET
Abs Immature Granulocytes: 0.02 10*3/uL (ref 0.00–0.07)
Basophils Absolute: 0 10*3/uL (ref 0.0–0.1)
Basophils Relative: 1 %
Eosinophils Absolute: 0.1 10*3/uL (ref 0.0–0.5)
Eosinophils Relative: 2 %
HCT: 40 % (ref 36.0–46.0)
Hemoglobin: 14.3 g/dL (ref 12.0–15.0)
Immature Granulocytes: 0 %
Lymphocytes Relative: 30 %
Lymphs Abs: 2.1 10*3/uL (ref 0.7–4.0)
MCH: 35.2 pg — ABNORMAL HIGH (ref 26.0–34.0)
MCHC: 35.8 g/dL (ref 30.0–36.0)
MCV: 98.5 fL (ref 80.0–100.0)
Monocytes Absolute: 0.3 10*3/uL (ref 0.1–1.0)
Monocytes Relative: 5 %
Neutro Abs: 4.3 10*3/uL (ref 1.7–7.7)
Neutrophils Relative %: 62 %
Platelets: 296 10*3/uL (ref 150–400)
RBC: 4.06 MIL/uL (ref 3.87–5.11)
RDW: 12.7 % (ref 11.5–15.5)
WBC: 6.9 10*3/uL (ref 4.0–10.5)
nRBC: 0 % (ref 0.0–0.2)

## 2018-07-12 LAB — TECHNOLOGIST SMEAR REVIEW

## 2018-07-12 LAB — RETICULOCYTES
Immature Retic Fract: 9.5 % (ref 2.3–15.9)
RBC.: 4.06 MIL/uL (ref 3.87–5.11)
Retic Count, Absolute: 109.6 10*3/uL (ref 19.0–186.0)
Retic Ct Pct: 2.7 % (ref 0.4–3.1)

## 2018-07-12 NOTE — Progress Notes (Signed)
Pt has migraines, and recently started having dizziness and they are now tapering her off nortriptyline to see if that would help. She feels bad for a while she is tired. She also has weird feeling in her head-feels foggy and can't concentrate over las 6 months.

## 2018-07-13 LAB — MULTIPLE MYELOMA PANEL, SERUM
Albumin SerPl Elph-Mcnc: 3.5 g/dL (ref 2.9–4.4)
Albumin/Glob SerPl: 1.2 (ref 0.7–1.7)
Alpha 1: 0.2 g/dL (ref 0.0–0.4)
Alpha2 Glob SerPl Elph-Mcnc: 0.8 g/dL (ref 0.4–1.0)
B-Globulin SerPl Elph-Mcnc: 1.1 g/dL (ref 0.7–1.3)
Gamma Glob SerPl Elph-Mcnc: 1 g/dL (ref 0.4–1.8)
Globulin, Total: 3 g/dL (ref 2.2–3.9)
IgA: 289 mg/dL (ref 87–352)
IgG (Immunoglobin G), Serum: 833 mg/dL (ref 586–1602)
IgM (Immunoglobulin M), Srm: 170 mg/dL (ref 26–217)
Total Protein ELP: 6.5 g/dL (ref 6.0–8.5)

## 2018-07-13 LAB — KAPPA/LAMBDA LIGHT CHAINS
Kappa free light chain: 21.1 mg/L — ABNORMAL HIGH (ref 3.3–19.4)
Kappa, lambda light chain ratio: 1.34 (ref 0.26–1.65)
Lambda free light chains: 15.7 mg/L (ref 5.7–26.3)

## 2018-07-18 ENCOUNTER — Encounter: Payer: Self-pay | Admitting: Oncology

## 2018-07-20 ENCOUNTER — Other Ambulatory Visit: Payer: Self-pay | Admitting: Obstetrics and Gynecology

## 2018-07-20 ENCOUNTER — Telehealth: Payer: Self-pay | Admitting: Oncology

## 2018-07-20 ENCOUNTER — Other Ambulatory Visit: Payer: Self-pay

## 2018-07-20 DIAGNOSIS — Z1231 Encounter for screening mammogram for malignant neoplasm of breast: Secondary | ICD-10-CM

## 2018-07-20 NOTE — Telephone Encounter (Signed)
Spoke to pt and completed travel screen. Also explained about addl screening questions they will be asked, new guidelines about mask req, no visitors, and fever checks °

## 2018-07-21 ENCOUNTER — Inpatient Hospital Stay (HOSPITAL_BASED_OUTPATIENT_CLINIC_OR_DEPARTMENT_OTHER): Payer: Commercial Managed Care - PPO | Admitting: Oncology

## 2018-07-21 ENCOUNTER — Other Ambulatory Visit: Payer: Self-pay

## 2018-07-21 ENCOUNTER — Encounter: Payer: Self-pay | Admitting: Oncology

## 2018-07-21 DIAGNOSIS — D7589 Other specified diseases of blood and blood-forming organs: Secondary | ICD-10-CM | POA: Diagnosis not present

## 2018-07-21 NOTE — Progress Notes (Signed)
Pt to get results of labs.

## 2018-07-23 NOTE — Progress Notes (Signed)
I connected with Wendy Russell on 07/23/18 at  9:45 AM EDT by video enabled telemedicine visit and verified that I am speaking with the correct person using two identifiers.   I discussed the limitations, risks, security and privacy concerns of performing an evaluation and management service by telemedicine and the availability of in-person appointments. I also discussed with the patient that there may be a patient responsible charge related to this service. The patient expressed understanding and agreed to proceed.  Other persons participating in the visit and their role in the encounter:  none  Patient's location:  home Provider's location:  work  Risk analyst Complaint:  Discuss results of bloodwork  History of present illness: Patient is a 48 year old female with a past medical history significant for Anxiety, migraine among other medical problems.  She recently had labs drawn on 06/28/2018 which showed a white count of 7.2, H&H of 14.3/41.1 with an MCV of 103 and a platelet count of 237.  Of note patient has had macrocytosis with an MCV ranging from 101 203 since October 2019.  Folate levels were normal in January 2020.  Thyroid levels were normal at that time.  B12 levels were low at 181 in January 2020 but normalized to 464 in May 2020.  Last CMP from January 2020 was within normal limits. She has remote h/o alcohol intake but currently states she has a glass of wine 2-3 times ayear. No known liver disease  She has h/o migraines and was on nortryptiline. It made her dizzy and now she is coming off the medication. Denies any OTC meds or herbal meds. No recent changes in her medications  Results of blood work from 07/12/2018 were as follows: CBC with differential showed white count of 6.9, H&H of 14.3/40 with an MCV of 98.5 and a platelet count of 296.  Multiple myeloma panel showed no M protein.  Kappa was slightly elevated at 21.1 but free light chain ratio was normal at 1.34.  Reticulocyte count was  normal at 2.7.  Smear review was unremarkable.  Interval history: denies any specific complaints at this time   Review of Systems  Constitutional: Negative for chills, fever, malaise/fatigue and weight loss.  HENT: Negative for congestion, ear discharge and nosebleeds.   Eyes: Negative for blurred vision.  Respiratory: Negative for cough, hemoptysis, sputum production, shortness of breath and wheezing.   Cardiovascular: Negative for chest pain, palpitations, orthopnea and claudication.  Gastrointestinal: Negative for abdominal pain, blood in stool, constipation, diarrhea, heartburn, melena, nausea and vomiting.  Genitourinary: Negative for dysuria, flank pain, frequency, hematuria and urgency.  Musculoskeletal: Negative for back pain, joint pain and myalgias.  Skin: Negative for rash.  Neurological: Negative for dizziness, tingling, focal weakness, seizures, weakness and headaches.  Endo/Heme/Allergies: Does not bruise/bleed easily.  Psychiatric/Behavioral: Negative for depression and suicidal ideas. The patient does not have insomnia.     Allergies  Allergen Reactions  . Codeine Anaphylaxis  . Aspirin Other (See Comments)    Nose bleeds  . Doxycycline Nausea And Vomiting  . Tramadol     Tingling in face    Past Medical History:  Diagnosis Date  . Anemia    H/O  . Anxiety   . GERD (gastroesophageal reflux disease)    OCC-TUMS PRN  . Macrocytosis   . Migraines    MIGRAINES    Past Surgical History:  Procedure Laterality Date  . [place] N/A 07/08/2017  Procedure: DILATATION & CURETTAGE/HYSTEROSCOPY WITH NOVASURE ABLATION;  Surgeon: Schermerhorn, Gwen Her, MD;  Location: [facility];  Service: Gynecology;  Laterality: N/A;  . LAPAROSCOPIC BILATERAL SALPINGECTOMY Bilateral 11/22/2017   Procedure: LAPAROSCOPIC BILATERAL SALPINGECTOMY;  Surgeon: Schermerhorn, Gwen Her, MD;  Location: [facility];   Service: Gynecology;  Laterality: Bilateral;  . LAPAROSCOPIC SUPRACERVICAL HYSTERECTOMY N/A 11/22/2017   Procedure: LAPAROSCOPIC SUPRACERVICAL HYSTERECTOMY;  Surgeon: Schermerhorn, Gwen Her, MD;  Location: [facility] OR[facility];  Service: Gynecology;  Laterality: N/A;  . TUBAL LIGATION  1993  . WISDOM TOOTH EXTRACTION      Social History   Socioeconomic History  . Marital status: Married    Spouse name: Not on file  . Number of children: Not on file  . Years of education: Not on file  . Highest education level: Not on file  Occupational History  . Not on file  Social Needs  . Financial resource strain: Not on file  . Food insecurity    Worry: Not on file    Inability: Not on file  . Transportation needs    Medical: Not on file    Non-medical: Not on file  Tobacco Use  . Smoking status: Current Every Day Smoker    Packs/day: 0.25    Years: 23.00    Pack years: 5.75    Types: Cigarettes  . Smokeless tobacco: Never Used  . Tobacco comment: now to 6 cig. a day and dec. by 1 cig every 3 days  Substance and Sexual Activity  . Alcohol use: Yes    Comment: RARE  . Drug use: No  . Sexual activity: Yes  Lifestyle  . Physical activity    Days per week: Not on file    Minutes per session: Not on file  . Stress: Not on file  Relationships  . Social Herbalist on phone: Not on file    Gets together: Not on file    Attends religious service: Not on file    Active member of club or organization: Not on file    Attends meetings of clubs or organizations: Not on file    Relationship status: Not on file  . Intimate partner violence    Fear of current or ex partner: Not on file    Emotionally abused: Not on file    Physically abused: Not on file    Forced sexual activity: Not on file  Other Topics Concern  . Not on file  Social History Narrative  . Not on file    Family History  Problem Relation Age of Onset  . Breast cancer Mother 77  . Stomach cancer Maternal Aunt       Current Outpatient Medications:  .  ibuprofen (ADVIL,MOTRIN) 800 MG tablet, Take 1 tablet (800 mg total) by mouth every 8 (eight) hours as needed for moderate pain., Disp: 30 tablet, Rfl: 0 .  solifenacin (VESICARE) 5 MG tablet, Take 1 tablet (5 mg total) by mouth daily., Disp: 30 tablet, Rfl: 11 .  SUMAtriptan (IMITREX) 100 MG tablet, Take 100 mg by mouth as needed for migraine., Disp: , Rfl:   No results found.  No images are attached to the encounter.   CMP Latest Ref Rng & Units 11/29/2017  Glucose 70 - 99 mg/dL 110(H)  BUN 6 - 20 mg/dL 12  Creatinine 0.44 - 1.00 mg/dL 0.74  Sodium 135 - 145 mmol/L 138  Potassium 3.5 - 5.1 mmol/L 3.8  Chloride 98 - 111 mmol/L 103  CO2 22 -  32 mmol/L 26  Calcium 8.9 - 10.3 mg/dL 9.0  Total Protein 6.5 - 8.1 g/dL 7.7  Total Bilirubin 0.3 - 1.2 mg/dL 0.6  Alkaline Phos 38 - 126 U/L 52  AST 15 - 41 U/L 23  ALT 0 - 44 U/L 24   CBC Latest Ref Rng & Units 07/12/2018  WBC 4.0 - 10.5 K/uL 6.9  Hemoglobin 12.0 - 15.0 g/dL 14.3  Hematocrit 36.0 - 46.0 % 40.0  Platelets 150 - 400 K/uL 296     Observation/objective: Appears in no acute distress of a video visit today.  Breathing is nonlabored  Assessment and plan: Patient is a 47 year old female referred for macrocytosis 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  On repeat blood work her macrocytosis has resolved.  Her CBC is otherwise unremarkable.  Reticulocyte count is normal indicating no ongoing hemolysis.  Multiple myeloma panel was unremarkable.  Serum kappa was mildly elevated but kappa lambda light chain ratio was normal.  At this point there is no further intervention required and I will see her 1 time back in 6 months and if her MCV continues to be normal she can continue to follow-up with Dr. Ouida Sills  Follow-up instructions: I will see her back in 6 months with a CBC with differential  I discussed the assessment and treatment plan with the patient. The patient was provided an opportunity to ask questions and  all were answered. The patient agreed with the plan and demonstrated an understanding of the instructions.   The patient was advised to call back or seek an in-person evaluation if the symptoms worsen or if the condition fails to improve as anticipated.   Visit Diagnosis: 1. Macrocytosis 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     Dr. Randa Evens, MD, MPH [facility] at [facility] [phone] 07/23/2018 6:45 PM

## 2018-07-26 ENCOUNTER — Encounter: Payer: Self-pay | Admitting: Urology

## 2018-07-26 ENCOUNTER — Other Ambulatory Visit: Payer: Self-pay

## 2018-07-26 ENCOUNTER — Ambulatory Visit (INDEPENDENT_AMBULATORY_CARE_PROVIDER_SITE_OTHER): Payer: Commercial Managed Care - PPO | Admitting: Urology

## 2018-07-26 VITALS — BP 120/81 | HR 79 | Ht 59.0 in | Wt 160.7 lb

## 2018-07-26 DIAGNOSIS — R102 Pelvic and perineal pain: Secondary | ICD-10-CM | POA: Diagnosis not present

## 2018-07-26 DIAGNOSIS — R1032 Left lower quadrant pain: Secondary | ICD-10-CM | POA: Insufficient documentation

## 2018-07-26 LAB — URINALYSIS, COMPLETE
Bilirubin, UA: NEGATIVE
Glucose, UA: NEGATIVE
Ketones, UA: NEGATIVE
Nitrite, UA: NEGATIVE
Protein,UA: NEGATIVE
Specific Gravity, UA: 1.025 (ref 1.005–1.030)
Urobilinogen, Ur: 0.2 mg/dL (ref 0.2–1.0)
pH, UA: 7.5 (ref 5.0–7.5)

## 2018-07-26 LAB — MICROSCOPIC EXAMINATION

## 2018-07-26 MED ORDER — LIDOCAINE HCL URETHRAL/MUCOSAL 2 % EX GEL
1.0000 "application " | Freq: Once | CUTANEOUS | Status: AC
  Administered 2018-07-26: 1 via URETHRAL

## 2018-07-26 MED ORDER — CEPHALEXIN 250 MG PO CAPS
500.0000 mg | ORAL_CAPSULE | Freq: Once | ORAL | Status: AC
  Administered 2018-07-26: 500 mg via ORAL

## 2018-07-26 NOTE — Patient Instructions (Signed)
Kegel Exercises  Kegel exercises help strengthen the muscles that support the rectum, vagina, small intestine, bladder, and uterus. Doing Kegel exercises can help:   Improve bladder and bowel control.   Improve sexual response.   Reduce problems and discomfort during pregnancy.  Kegel exercises involve squeezing your pelvic floor muscles, which are the same muscles you squeeze when you try to stop the flow of urine. The exercises can be done while sitting, standing, or lying down, but it is best to vary your position.  Exercises  1. Squeeze your pelvic floor muscles tight. You should feel a tight lift in your rectal area. If you are a female, you should also feel a tightness in your vaginal area. Keep your stomach, buttocks, and legs relaxed.  2. Hold the muscles tight for up to 10 seconds.  3. Relax your muscles.  Repeat this exercise 50 times a day or as many times as told by your health care provider. Continue to do this exercise for at least 4-6 weeks or for as long as told by your health care provider.  This information is not intended to replace advice given to you by your health care provider. Make sure you discuss any questions you have with your health care provider.  Document Released: 01/05/2012 Document Revised: 05/31/2016 Document Reviewed: 12/08/2014  Elsevier Interactive Patient Education  2019 [employer] Inc[employer]

## 2018-07-26 NOTE — Progress Notes (Signed)
   07/26/18  CC: No chief complaint on file.   HPI: Wendy Russell is a 48 yo female who underwent Hx Oct 2019. A foley was placed and caused bladder pain. She also had LLQ pain. She said she had pain on the left prior to the Hx but it was up a little higher on the left. She underwent CT scan of the abd and pelvis Nov 29, 2017 which was normal. She has no constipation. She voids with a good flow. She has frequency. She has nocturia. She has dyspareunia but that was before the surgery, too. She is on nortriptyline. No other NG risk. Her UA was normal and Cr 0.74.  She started solifenacin 5 mg March 2020. She returns for cystoscopy.  She is doing Production designer, theatre/television/film. Her pelvic pain and frequency are much improved.   Last menstrual period 06/20/2017. NED. A&Ox3.   No respiratory distress   Abd soft, NT, ND Normal external genitalia with patent urethral meatus The bladder and urethra were palpably normal.  She had some mild right greater than left pelvic floor pain.  She had a good Kegel contraction.  Cystoscopy Procedure Note  Patient identification was confirmed, informed consent was obtained, and patient was prepped using Betadine solution.  Lidocaine jelly was administered per urethral meatus.    Procedure: - Flexible cystoscope introduced, without any difficulty.   - Thorough search of the bladder revealed:    normal urethral meatus    normal urothelium    no stones    no ulcers     no tumors    no urethral polyps    no trabeculation  - Ureteral orifices were normal in position and appearance.  Post-Procedure: - Patient tolerated the procedure well Chaperone-Oki-for exam and cystoscopy   A/p : Frequency, pelvic pain-normal exam, cystoscopy.  She will continue Kegel exercise.  She can take solifenacin as needed but if it is expensive she can discontinue it or we can change it.

## 2018-08-29 ENCOUNTER — Ambulatory Visit
Admission: RE | Admit: 2018-08-29 | Discharge: 2018-08-29 | Disposition: A | Discharge status: Home / Self Care | Payer: Commercial Managed Care - PPO | Source: Ambulatory Visit | Attending: Obstetrics and Gynecology | Admitting: Obstetrics and Gynecology

## 2018-08-29 ENCOUNTER — Other Ambulatory Visit: Payer: Self-pay

## 2018-08-29 DIAGNOSIS — Z1231 Encounter for screening mammogram for malignant neoplasm of breast: Secondary | ICD-10-CM | POA: Insufficient documentation

## 2018-09-05 ENCOUNTER — Ambulatory Visit: Payer: Self-pay | Admitting: Surgery

## 2018-09-05 NOTE — H&P (View-Only) (Signed)
Subjective:  CC: Grade I hemorrhoids [K64.0]   HPI:  Wendy Russell is a 48 y.o. female who returns today  To discuss open hemorrhoidectomy.  No major changes in symptoms since last visit.   Toilet habits: 3-4, soft, BMs a day.   Last colonoscopy in 2017, recommended to return in unknown, told she has polyps.   Past Medical History:  has a past medical history of Abnormal vaginal bleeding, unspecified and Migraine.  Past Surgical History:  has a past surgical history that includes Cesarean section; Fractional D&C, hysteroscopy and Novasure (07/08/2017); Dilation and curettage of uterus (07/08/2017); Endometrial ablation (07/08/2017); Tubal ligation; [place] with bilateral salpingectomy (11/22/2017); and Hysterectomy (11/22/2017).  Family History: family history includes Breast cancer in her mother; Diabetes type II in her father; Migraines in her sister; Ovarian cancer in her mother.  Social History:  reports that she has been smoking. She has been smoking about 0.25 packs per day for the past 0.00 years. She has never used smokeless tobacco. She reports current alcohol use. She reports that she does not use drugs.  Current Medications: has a current medication list which includes the following prescription(s): azelastine, ibuprofen, solifenacin, and sumatriptan.  Allergies:  Allergies as of 09/05/2018 - Reviewed 09/05/2018  Allergen Reaction Noted  . Codeine Anaphylaxis 10/24/2015  . Aspirin Other (See Comments) 10/24/2015  . Doxycycline Vomiting 12/01/2016  . Tramadol Other (See Comments) 11/28/2017    ROS:  General: Denies weight loss, weight gain, fatigue, fevers, chills, and night sweats. Heart: Denies chest pain, palpitations, racing heart, irregular heartbeat, leg pain or swelling, and decreased activity tolerance. Respiratory: Denies breathing difficulty, shortness of breath, wheezing, cough, and sputum. GI: Denies change in appetite, heartburn, nausea, vomiting,  constipation, diarrhea, and blood in stool. GU: Denies difficulty urinating, pain with urinating, urgency, frequency, blood in urine   Objective:   BP 124/73   Pulse 69   Ht 149.9 cm (4\' 11" )   Wt 73 kg (161 lb)   LMP 03/17/2017 (Approximate)   BMI 32.52 kg/m   Constitutional :  alert, appears stated age, cooperative and no distress  Lymphatics/Throat::  no asymmetry, masses, or scars  Respiratory:  clear to auscultation bilaterally  Cardiovascular:  regular rate and rhythm  Gastrointestinal: soft, non-tender; bowel sounds normal; no masses,  no organomegaly.    Musculoskeletal: Steady gait and movement  Skin: Cool and moist  Psychiatric: Normal affect, non-agitated, not confused  Genital/Rectal: Deferred for today    LABS:  n/a   RADS: n/a Assessment:   Grade I hemorrhoids [K64.0]  External skin tags, multiple Plan:  1. Grade I hemorrhoids [K64.0] Discussed risks/benefits/alternatives to surgery.  Alternatives include the options of observation, medical management.  Benefits include symptomatic relief.  I discussed  in detail and the complications related to the operation and the anesthesia, including bleeding, infection, recurrence, remote possibility of temporary or permanent fecal incontinence, poor/delayed wound healing, chronic pain, and additional procedures to address said risks. The risks of general anesthetic, if used, includes MI, CVA, sudden death or even reaction to anesthetic medications also discussed.   We also discussed typical post operative recovery which includes weeks to potentially months of anal pain, drainage, occasional bleeding, and sense of fecal urgency.   ED return precautions given for sudden increase in pain, bleeding, with possible accompanying fever, nausea, and/or vomiting.  The patient understands the risks, any and all questions were answered to the patient's satisfaction.  Will proceed with EUA, open hemorrhoidectomy. No opiods,  including ultram.  Electronically signed by Benjamine Sprague, DO on 09/05/2018 3:48 PM

## 2018-09-05 NOTE — H&P (Signed)
Subjective:  CC: Grade I hemorrhoids [K64.0]   HPI:  Wendy Russell is a 48 y.o. female who returns today  To discuss open hemorrhoidectomy.  No major changes in symptoms since last visit.   Toilet habits: 3-4, soft, BMs a day.   Last colonoscopy in 2017, recommended to return in unknown, told she has polyps.   Past Medical History:  has a past medical history of Abnormal vaginal bleeding, unspecified and Migraine.  Past Surgical History:  has a past surgical history that includes Cesarean section; Fractional D&C, hysteroscopy and Novasure (07/08/2017); Dilation and curettage of uterus (07/08/2017); Endometrial ablation (07/08/2017); Tubal ligation; [place] with bilateral salpingectomy (11/22/2017); and Hysterectomy (11/22/2017).  Family History: family history includes Breast cancer in her mother; Diabetes type II in her father; Migraines in her sister; Ovarian cancer in her mother.  Social History:  reports that she has been smoking. She has been smoking about 0.25 packs per day for the past 0.00 years. She has never used smokeless tobacco. She reports current alcohol use. She reports that she does not use drugs.  Current Medications: has a current medication list which includes the following prescription(s): azelastine, ibuprofen, solifenacin, and sumatriptan.  Allergies:  Allergies as of 09/05/2018 - Reviewed 09/05/2018  Allergen Reaction Noted  . Codeine Anaphylaxis 10/24/2015  . Aspirin Other (See Comments) 10/24/2015  . Doxycycline Vomiting 12/01/2016  . Tramadol Other (See Comments) 11/28/2017    ROS:  General: Denies weight loss, weight gain, fatigue, fevers, chills, and night sweats. Heart: Denies chest pain, palpitations, racing heart, irregular heartbeat, leg pain or swelling, and decreased activity tolerance. Respiratory: Denies breathing difficulty, shortness of breath, wheezing, cough, and sputum. GI: Denies change in appetite, heartburn, nausea, vomiting,  constipation, diarrhea, and blood in stool. GU: Denies difficulty urinating, pain with urinating, urgency, frequency, blood in urine   Objective:   BP 124/73   Pulse 69   Ht 149.9 cm (4\' 11" )   Wt 73 kg (161 lb)   LMP 03/17/2017 (Approximate)   BMI 32.52 kg/m   Constitutional :  alert, appears stated age, cooperative and no distress  Lymphatics/Throat::  no asymmetry, masses, or scars  Respiratory:  clear to auscultation bilaterally  Cardiovascular:  regular rate and rhythm  Gastrointestinal: soft, non-tender; bowel sounds normal; no masses,  no organomegaly.    Musculoskeletal: Steady gait and movement  Skin: Cool and moist  Psychiatric: Normal affect, non-agitated, not confused  Genital/Rectal: Deferred for today    LABS:  n/a   RADS: n/a Assessment:   Grade I hemorrhoids [K64.0]  External skin tags, multiple Plan:  1. Grade I hemorrhoids [K64.0] Discussed risks/benefits/alternatives to surgery.  Alternatives include the options of observation, medical management.  Benefits include symptomatic relief.  I discussed  in detail and the complications related to the operation and the anesthesia, including bleeding, infection, recurrence, remote possibility of temporary or permanent fecal incontinence, poor/delayed wound healing, chronic pain, and additional procedures to address said risks. The risks of general anesthetic, if used, includes MI, CVA, sudden death or even reaction to anesthetic medications also discussed.   We also discussed typical post operative recovery which includes weeks to potentially months of anal pain, drainage, occasional bleeding, and sense of fecal urgency.   ED return precautions given for sudden increase in pain, bleeding, with possible accompanying fever, nausea, and/or vomiting.  The patient understands the risks, any and all questions were answered to the patient's satisfaction.  Will proceed with EUA, open hemorrhoidectomy. No opiods,  including ultram.  Electronically signed by Benjamine Sprague, DO on 09/05/2018 3:48 PM

## 2018-09-18 ENCOUNTER — Other Ambulatory Visit: Payer: Self-pay

## 2018-09-18 ENCOUNTER — Encounter
Admission: RE | Admit: 2018-09-18 | Discharge: 2018-09-18 | Disposition: A | Discharge status: Home / Self Care | Payer: Commercial Managed Care - PPO | Source: Ambulatory Visit | Attending: Surgery | Admitting: Surgery

## 2018-09-18 HISTORY — DX: Dizziness and giddiness: R42

## 2018-09-18 HISTORY — DX: Unspecified osteoarthritis, unspecified site: M19.90

## 2018-09-18 HISTORY — DX: Family history of other specified conditions: Z84.89

## 2018-09-18 HISTORY — DX: Claustrophobia: F40.240

## 2018-09-18 HISTORY — DX: Elevated blood-pressure reading, without diagnosis of hypertension: R03.0

## 2018-09-18 NOTE — Pre-Procedure Instructions (Signed)
Sindy Guadeloupe, MD  Physician  Hematology     Progress Notes  Signed     Encounter Date:  07/12/2018                  Signed          Expand All Collapse All            Expand widget buttonCollapse widget button    Show:Clear all   ManualTemplateCopied  Added by:     Sindy Guadeloupe, MD   Hover for detailscustomization button                                                                                                        I connected with Nashiya Disbrow on 07/11/18 at 11:15 AM EDT by video enabled telemedicine visit and verified that I am speaking with the correct person using two identifiers.     I discussed the limitations, risks, security and privacy concerns of performing an evaluation and management service by telemedicine and the availability of in-person appointments. I also discussed with the patient that there may be a patient responsible charge related to this service. The patient expressed understanding and agreed to proceed.     Other persons participating in the visit and their role in the encounter:  none     Patient's location:  home  Provider's location:  home     Reason for referral: Macrocytosis  Referring provider Dr. Ouida Sills        History of present illness:   Patient is a 48 year old female with a past medical history significant for Anxiety, migraine among other medical problems.  She recently had labs drawn on 06/28/2018 which showed a white count of 7.2, H&H of 14.3/41.1 with an MCV of 103 and a platelet count of 237.  Of note patient has had macrocytosis with an MCV ranging from 101 203 since October 2019.  Folate levels were normal in January 2020.  Thyroid levels were normal at that time.  B12 levels were low at 181 in January 2020 but normalized to 464 in May 2020.  Last CMP from January 2020 was  within normal limits. She has remote h/o alcohol intake but currently states she has a glass of wine 2-3 times ayear. No known liver disease     She has h/o migraines and was on nortryptiline. It made her dizzy and now she is coming off the medication. Denies any OTC meds or herbal meds. No recent changes in her medications        Review of Systems   Constitutional: Negative for chills, fever, malaise/fatigue and weight loss.   HENT: Negative for congestion, ear discharge and nosebleeds.    Eyes: Negative for blurred vision.   Respiratory: Negative for cough, hemoptysis, sputum production, shortness of breath and wheezing.    Cardiovascular: Negative for chest pain, palpitations, orthopnea and claudication.   Gastrointestinal: Negative for abdominal pain, blood in stool, constipation, diarrhea, heartburn, melena, nausea and vomiting.   Genitourinary: Negative for dysuria, flank pain, frequency, hematuria and  urgency.   Musculoskeletal: Negative for back pain, joint pain and myalgias.   Skin: Negative for rash.   Neurological: Negative for dizziness, tingling, focal weakness, seizures, weakness and headaches.        Difficulty with concentration   Endo/Heme/Allergies: Does not bruise/bleed easily.   Psychiatric/Behavioral: Negative for depression and suicidal ideas. The patient does not have insomnia.                   Allergies    Allergen   Reactions    .   Codeine   Anaphylaxis    .   Aspirin   Other (See Comments)            Nose bleeds    .   Doxycycline   Nausea And Vomiting    .   Tramadol                Tingling in face              Past Medical History:    Diagnosis   Date    .   Anemia            H/O    .   Anxiety        .   GERD (gastroesophageal reflux disease)            OCC-TUMS PRN    .   Hypertension            H/O OFF MEDS SINCE 2018 DUE TO BP CONTROL     .   Migraines            MIGRAINES               Past Surgical History:    Procedure   Laterality   Date    .   [place] WITH NOVASURE ABLATION   N/A   07/08/2017        Procedure: DILATATION & CURETTAGE/HYSTEROSCOPY WITH NOVASURE ABLATION;  Surgeon: Schermerhorn, Gwen Her, MD;  Location: [facility] ORS;  Service: Gynecology;  Laterality: N/A;    .   LAPAROSCOPIC BILATERAL SALPINGECTOMY   Bilateral   11/22/2017        Procedure: LAPAROSCOPIC BILATERAL SALPINGECTOMY;  Surgeon: Schermerhorn, Gwen Her, MD;  Location: [facility];  Service: Gynecology;  Laterality: Bilateral;    .   LAPAROSCOPIC SUPRACERVICAL HYSTERECTOMY   N/A   11/22/2017        Procedure: LAPAROSCOPIC SUPRACERVICAL HYSTERECTOMY;  Surgeon: Schermerhorn, Gwen Her, MD;  Location: [facility];  Service: Gynecology;  Laterality: N/A;    .   TUBAL LIGATION       1993    .   WISDOM TOOTH EXTRACTION                  Social History             Socioeconomic History    .   Marital status:   Married            Spouse name:   Not on file    .   Number of children:   Not on file    .   Years of education:   Not on file    .   Highest education level:   Not on file    Occupational History    .  Not on file    Social Needs    .   Financial resource strain:   Not on file    .   Food insecurity:            Worry:   Not on file            Inability:   Not on file    .   Transportation needs:            Medical:   Not on file            Non-medical:   Not on file    Tobacco Use    .   Smoking status:   Current Every Day Smoker            Packs/day:   0.50            Years:   23.00            Pack years:   11.50            Types:   Cigarettes    .    Smokeless tobacco:   Never Used    Substance and Sexual Activity    .   Alcohol use:   Yes            Comment: RARE    .   Drug use:   No    .   Sexual activity:   Yes    Lifestyle    .   Physical activity:            Days per week:   Not on file            Minutes per session:   Not on file    .   Stress:   Not on file    Relationships    .   Social connections:            Talks on phone:   Not on file            Gets together:   Not on file            Attends religious service:   Not on file            Active member of club or organization:   Not on file            Attends meetings of clubs or organizations:   Not on file            Relationship status:   Not on file    .   Intimate partner violence:            Fear of current or ex partner:   Not on file            Emotionally abused:   Not on file            Physically abused:   Not on file            Forced sexual activity:   Not on file    Other Topics   Concern    .   Not on file    Social History Narrative    .   Not on file               Family History    Problem   Relation   Age of Onset    .   Breast cancer  Mother   35           Current Outpatient Medications:   .  ibuprofen (ADVIL,MOTRIN) 800 MG tablet, Take 1 tablet (800 mg total) by mouth every 8 (eight) hours as needed for moderate pain., Disp: 30 tablet, Rfl: 0  .  meperidine (DEMEROL) 50 MG tablet, Take 1 tablet (50 mg total) by mouth every 4 (four) hours as needed for moderate pain., Disp: 25 tablet, Rfl: 0  .  metaxalone (SKELAXIN) 800 MG tablet, Take 800 mg by mouth 3 (three) times daily., Disp: , Rfl:   .  nortriptyline (PAMELOR) 25 MG capsule, Take 1 capsule by mouth at bedtime., Disp: , Rfl: 3  .  solifenacin (VESICARE) 5 MG tablet, Take 1 tablet (5 mg total)  by mouth daily., Disp: 30 tablet, Rfl: 11      Imaging Results          No images are attached to the encounter.         CMP   Latest Ref Rng & Units   11/29/2017    Glucose   70 - 99 mg/dL   110(H)    BUN   6 - 20 mg/dL   12    Creatinine   0.44 - 1.00 mg/dL   0.74    Sodium   135 - 145 mmol/L   138    Potassium   3.5 - 5.1 mmol/L   3.8    Chloride   98 - 111 mmol/L   103    CO2   22 - 32 mmol/L   26    Calcium   8.9 - 10.3 mg/dL   9.0    Total Protein   6.5 - 8.1 g/dL   7.7    Total Bilirubin   0.3 - 1.2 mg/dL   0.6    Alkaline Phos   38 - 126 U/L   52    AST   15 - 41 U/L   23    ALT   0 - 44 U/L   24       CBC   Latest Ref Rng & Units   11/29/2017    WBC   4.0 - 10.5 K/uL   8.2    Hemoglobin   12.0 - 15.0 g/dL   14.0    Hematocrit   36.0 - 46.0 %   40.7    Platelets   150 - 400 K/uL   318            Observation/objective: Appears in no acute distress of a video visit today.  Breathing is nonlabored     Assessment and plan: Patient is a 48 year old female referred for macrocytosis 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     I will plan to check a CBC with differential, smear review, myeloma panel, serum free light chains, reticulocyte count at this time.       Follow-up instructions:Video visit with MD in 2 weeks time     I discussed the assessment and treatment plan with the patient. The patient was provided an opportunity to ask questions and all were answered. The patient agreed with the plan and demonstrated an understanding of the instructions.     The patient was advised to call back or seek an in-person evaluation if the symptoms worsen or if the condition fails to improve as anticipated.           Visit Diagnosis:   1.   Macrocytosis 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  Dr. Randa Evens, MD, MPH  [place] [facility]  [phone]  07/11/2018  3:27 PM             Electronically signed by Sindy Guadeloupe, MD at 07/18/2018  8:29 AM             Office Visit on 07/12/2018               Detailed Report

## 2018-09-18 NOTE — Patient Instructions (Addendum)
Your procedure is scheduled on: 09-22-18 FRIDAY Report to Same Day Surgery 2nd floor medical mall [facility] Entrance-take elevator on left to 2nd floor.  Check in with surgery information desk.) To find out your arrival time please call [phone] between 1PM - 3PM on 09-21-18 THURSDAY  Remember: Instructions that are not followed completely may result in serious medical risk, up to and including death, or upon the discretion of your surgeon and anesthesiologist your surgery may need to be rescheduled.    _x___ 1. Do not eat food after midnight the night before your procedure. NO GUM OR CANDY AFTER MIDNIGHT.  You may drink clear liquids up to 2 hours before you are scheduled to arrive at the hospital for your procedure.  Do not drink clear liquids within 2 hours of your scheduled arrival to the hospital.  Clear liquids include  --Water or Apple juice without pulp  --Gatorade  --Black Coffee or Clear Tea (No milk, no creamers, do not add anything to  the coffee or Tea   ____Ensure clear carbohydrate drink on the way to the hospital for bariatric patients  ____Ensure clear carbohydrate drink 3 hours before surgery.    __x__ 2. No Alcohol for 24 hours before or after surgery.   __x__3. No Smoking or e-cigarettes for 24 prior to surgery.  Do not use any chewable tobacco products for at least 6 hour prior to surgery   ____  4. Bring all medications with you on the day of surgery if instructed.    __x__ 5. Notify your doctor if there is any change in your medical condition     (cold, fever, infections).    x___6. On the morning of surgery brush your teeth with toothpaste and water.  You may rinse your mouth with mouth wash if you wish.  Do not swallow any toothpaste or mouthwash.   Do not wear jewelry, make-up, hairpins, clips or nail polish.  Do not wear lotions, powders, or perfumes. You may wear deodorant.  Do not shave 48 hours prior to surgery. Men may shave face and neck.  Do  not bring valuables to the hospital.    [facility] is not responsible for any belongings or valuables.               Contacts, dentures or bridgework may not be worn into surgery.  Leave your suitcase in the car. After surgery it may be brought to your room.  For patients admitted to the hospital, discharge time is determined by your                       treatment team.  _  Patients discharged the day of surgery will not be allowed to drive home.  You will need someone to drive you home and stay with you the night of your procedure.    Please read over the following fact sheets that you were given:   [facility] Preparing for Surgery and or MRSA Information   _x___ TAKE THE FOLLOWING MEDICATION THE MORNING OF SURGERY WITH A SMALL SIP OF WATER. These include:  1. YOU MAY TAKE YOUR VESICARE THE MORNING OF SURGERY IF NEEDED  2.  3.  4.  5.  6.  _X___Fleets enema as directed-DO FLEET ENEMA AT HOME THE MORNING OF SURGERY 1 HOUR PRIOR TO ARRIVAL TIME TO HOSPITAL  ____ Use CHG Soap or sage wipes as directed on instruction sheet   ____ Use inhalers on the  day of surgery and bring to hospital day of surgery  ____ Stop Metformin and Janumet 2 days prior to surgery.    ____ Take 1/2 of usual insulin dose the night before surgery and none on the morning surgery.   ____ Follow recommendations from Cardiologist, Pulmonologist or PCP regarding stopping Aspirin, Coumadin, Plavix ,Eliquis, Effient, or Pradaxa, and Pletal.  X____Stop Anti-inflammatories such as Advil, Aleve, Ibuprofen, Motrin, Naproxen, Naprosyn, Goodies powders or aspirin products NOW-OK to take Tylenol    ____ Stop supplements until after surgery.   ____ Bring C-Pap to the hospital.

## 2018-09-19 ENCOUNTER — Other Ambulatory Visit
Admission: RE | Admit: 2018-09-19 | Discharge: 2018-09-19 | Disposition: A | Discharge status: Home / Self Care | Payer: Commercial Managed Care - PPO | Source: Ambulatory Visit | Attending: Surgery | Admitting: Surgery

## 2018-09-19 ENCOUNTER — Other Ambulatory Visit: Payer: Self-pay

## 2018-09-19 DIAGNOSIS — Z20828 Contact with and (suspected) exposure to other viral communicable diseases: Secondary | ICD-10-CM | POA: Insufficient documentation

## 2018-09-19 DIAGNOSIS — Z01812 Encounter for preprocedural laboratory examination: Secondary | ICD-10-CM | POA: Diagnosis present

## 2018-09-19 LAB — SARS CORONAVIRUS 2 (TAT 6-24 HRS): SARS Coronavirus 2: NEGATIVE

## 2018-09-22 ENCOUNTER — Other Ambulatory Visit: Payer: Self-pay

## 2018-09-22 ENCOUNTER — Ambulatory Visit: Payer: Commercial Managed Care - PPO | Admitting: Anesthesiology

## 2018-09-22 ENCOUNTER — Encounter: Payer: Self-pay | Admitting: Anesthesiology

## 2018-09-22 ENCOUNTER — Encounter
Admission: RE | Disposition: A | Discharge status: Home / Self Care | Payer: Self-pay | Source: Home / Self Care | Attending: Surgery

## 2018-09-22 ENCOUNTER — Ambulatory Visit
Admission: RE | Admit: 2018-09-22 | Discharge: 2018-09-22 | Disposition: A | Discharge status: Home / Self Care | Payer: Commercial Managed Care - PPO | Attending: Surgery | Admitting: Surgery

## 2018-09-22 DIAGNOSIS — F419 Anxiety disorder, unspecified: Secondary | ICD-10-CM | POA: Insufficient documentation

## 2018-09-22 DIAGNOSIS — I10 Essential (primary) hypertension: Secondary | ICD-10-CM | POA: Insufficient documentation

## 2018-09-22 DIAGNOSIS — F40240 Claustrophobia: Secondary | ICD-10-CM | POA: Diagnosis not present

## 2018-09-22 DIAGNOSIS — Z791 Long term (current) use of non-steroidal anti-inflammatories (NSAID): Secondary | ICD-10-CM | POA: Insufficient documentation

## 2018-09-22 DIAGNOSIS — Z886 Allergy status to analgesic agent status: Secondary | ICD-10-CM | POA: Insufficient documentation

## 2018-09-22 DIAGNOSIS — R42 Dizziness and giddiness: Secondary | ICD-10-CM | POA: Insufficient documentation

## 2018-09-22 DIAGNOSIS — Z820 Family history of epilepsy and other diseases of the nervous system: Secondary | ICD-10-CM | POA: Insufficient documentation

## 2018-09-22 DIAGNOSIS — Z885 Allergy status to narcotic agent status: Secondary | ICD-10-CM | POA: Insufficient documentation

## 2018-09-22 DIAGNOSIS — D649 Anemia, unspecified: Secondary | ICD-10-CM | POA: Diagnosis not present

## 2018-09-22 DIAGNOSIS — Z881 Allergy status to other antibiotic agents status: Secondary | ICD-10-CM | POA: Diagnosis not present

## 2018-09-22 DIAGNOSIS — K219 Gastro-esophageal reflux disease without esophagitis: Secondary | ICD-10-CM | POA: Diagnosis not present

## 2018-09-22 DIAGNOSIS — Z803 Family history of malignant neoplasm of breast: Secondary | ICD-10-CM | POA: Insufficient documentation

## 2018-09-22 DIAGNOSIS — Z79899 Other long term (current) drug therapy: Secondary | ICD-10-CM | POA: Diagnosis not present

## 2018-09-22 DIAGNOSIS — G43909 Migraine, unspecified, not intractable, without status migrainosus: Secondary | ICD-10-CM | POA: Insufficient documentation

## 2018-09-22 DIAGNOSIS — K644 Residual hemorrhoidal skin tags: Secondary | ICD-10-CM | POA: Diagnosis not present

## 2018-09-22 DIAGNOSIS — F17200 Nicotine dependence, unspecified, uncomplicated: Secondary | ICD-10-CM | POA: Diagnosis not present

## 2018-09-22 DIAGNOSIS — K643 Fourth degree hemorrhoids: Secondary | ICD-10-CM | POA: Diagnosis present

## 2018-09-22 DIAGNOSIS — M1990 Unspecified osteoarthritis, unspecified site: Secondary | ICD-10-CM | POA: Diagnosis not present

## 2018-09-22 DIAGNOSIS — Z833 Family history of diabetes mellitus: Secondary | ICD-10-CM | POA: Diagnosis not present

## 2018-09-22 DIAGNOSIS — Z8041 Family history of malignant neoplasm of ovary: Secondary | ICD-10-CM | POA: Diagnosis not present

## 2018-09-22 HISTORY — PX: HEMORRHOID SURGERY: SHX153

## 2018-09-22 SURGERY — HEMORRHOIDECTOMY
Anesthesia: General

## 2018-09-22 MED ORDER — FENTANYL CITRATE (PF) 100 MCG/2ML IJ SOLN
INTRAMUSCULAR | Status: AC
  Administered 2018-09-22: 25 ug via INTRAVENOUS
  Filled 2018-09-22: qty 2

## 2018-09-22 MED ORDER — ROCURONIUM BROMIDE 100 MG/10ML IV SOLN
INTRAVENOUS | Status: DC | PRN
  Administered 2018-09-22: 20 mg via INTRAVENOUS

## 2018-09-22 MED ORDER — SUGAMMADEX SODIUM 200 MG/2ML IV SOLN
INTRAVENOUS | Status: AC
  Filled 2018-09-22: qty 2

## 2018-09-22 MED ORDER — ACETAMINOPHEN 500 MG PO TABS
ORAL_TABLET | ORAL | Status: AC
  Filled 2018-09-22: qty 2

## 2018-09-22 MED ORDER — IBUPROFEN 800 MG PO TABS: 800.0000 mg | ORAL_TABLET | Freq: Three times a day (TID) | ORAL | 0 refills | Status: DC | PRN

## 2018-09-22 MED ORDER — BUPIVACAINE HCL (PF) 0.5 % IJ SOLN
INTRAMUSCULAR | Status: AC
  Filled 2018-09-22: qty 30

## 2018-09-22 MED ORDER — ONDANSETRON HCL 4 MG/2ML IJ SOLN
INTRAMUSCULAR | Status: DC | PRN
  Administered 2018-09-22: 4 mg via INTRAVENOUS

## 2018-09-22 MED ORDER — DEXAMETHASONE SODIUM PHOSPHATE 10 MG/ML IJ SOLN
INTRAMUSCULAR | Status: DC | PRN
  Administered 2018-09-22: 5 mg via INTRAVENOUS

## 2018-09-22 MED ORDER — FAMOTIDINE 20 MG PO TABS
20.0000 mg | ORAL_TABLET | Freq: Once | ORAL | Status: AC
  Administered 2018-09-22: 20 mg via ORAL

## 2018-09-22 MED ORDER — FENTANYL CITRATE (PF) 100 MCG/2ML IJ SOLN
INTRAMUSCULAR | Status: DC | PRN
  Administered 2018-09-22 (×2): 50 ug via INTRAVENOUS

## 2018-09-22 MED ORDER — GABAPENTIN 300 MG PO CAPS
ORAL_CAPSULE | ORAL | Status: AC
  Filled 2018-09-22: qty 1

## 2018-09-22 MED ORDER — FAMOTIDINE 20 MG PO TABS
ORAL_TABLET | ORAL | Status: AC
  Filled 2018-09-22: qty 1

## 2018-09-22 MED ORDER — BUPIVACAINE-EPINEPHRINE (PF) 0.5% -1:200000 IJ SOLN
INTRAMUSCULAR | Status: DC | PRN
  Administered 2018-09-22: 4 mL

## 2018-09-22 MED ORDER — SUCCINYLCHOLINE CHLORIDE 20 MG/ML IJ SOLN
INTRAMUSCULAR | Status: DC | PRN
  Administered 2018-09-22: 100 mg via INTRAVENOUS

## 2018-09-22 MED ORDER — ACETAMINOPHEN 325 MG PO TABS: 650.0000 mg | ORAL_TABLET | Freq: Three times a day (TID) | ORAL | 0 refills | Status: AC | PRN

## 2018-09-22 MED ORDER — LACTATED RINGERS IV SOLN
INTRAVENOUS | Status: DC
  Administered 2018-09-22: 12:00:00 via INTRAVENOUS

## 2018-09-22 MED ORDER — FLEET ENEMA 7-19 GM/118ML RE ENEM: 1.0000 | ENEMA | Freq: Once | RECTAL | Status: DC

## 2018-09-22 MED ORDER — LIDOCAINE HCL (PF) 1 % IJ SOLN
INTRAMUSCULAR | Status: AC
  Filled 2018-09-22: qty 30

## 2018-09-22 MED ORDER — FENTANYL CITRATE (PF) 100 MCG/2ML IJ SOLN
25.0000 ug | INTRAMUSCULAR | Status: DC | PRN
  Administered 2018-09-22 (×2): 25 ug via INTRAVENOUS

## 2018-09-22 MED ORDER — PROPOFOL 10 MG/ML IV BOLUS
INTRAVENOUS | Status: DC | PRN
  Administered 2018-09-22: 150 mg via INTRAVENOUS

## 2018-09-22 MED ORDER — PROPOFOL 10 MG/ML IV BOLUS
INTRAVENOUS | Status: AC
  Filled 2018-09-22: qty 20

## 2018-09-22 MED ORDER — BUPIVACAINE LIPOSOME 1.3 % IJ SUSP
INTRAMUSCULAR | Status: DC | PRN
  Administered 2018-09-22: 20 mL

## 2018-09-22 MED ORDER — ONDANSETRON HCL 4 MG/2ML IJ SOLN: 4.0000 mg | Freq: Once | INTRAMUSCULAR | Status: DC | PRN

## 2018-09-22 MED ORDER — BUPIVACAINE LIPOSOME 1.3 % IJ SUSP
INTRAMUSCULAR | Status: AC
  Filled 2018-09-22: qty 20

## 2018-09-22 MED ORDER — CHLORHEXIDINE GLUCONATE CLOTH 2 % EX PADS: 6.0000 | MEDICATED_PAD | Freq: Once | CUTANEOUS | Status: DC

## 2018-09-22 MED ORDER — FENTANYL CITRATE (PF) 100 MCG/2ML IJ SOLN
INTRAMUSCULAR | Status: AC
  Filled 2018-09-22: qty 2

## 2018-09-22 MED ORDER — MIDAZOLAM HCL 2 MG/2ML IJ SOLN
INTRAMUSCULAR | Status: AC
  Filled 2018-09-22: qty 2

## 2018-09-22 MED ORDER — LIDOCAINE HCL (CARDIAC) PF 100 MG/5ML IV SOSY
PREFILLED_SYRINGE | INTRAVENOUS | Status: DC | PRN
  Administered 2018-09-22: 70 mg via INTRAVENOUS

## 2018-09-22 MED ORDER — DOCUSATE SODIUM 100 MG PO CAPS: 100.0000 mg | ORAL_CAPSULE | Freq: Two times a day (BID) | ORAL | 0 refills | Status: AC | PRN

## 2018-09-22 MED ORDER — MIDAZOLAM HCL 2 MG/2ML IJ SOLN
INTRAMUSCULAR | Status: DC | PRN
  Administered 2018-09-22: 2 mg via INTRAVENOUS

## 2018-09-22 MED ORDER — BUPIVACAINE-EPINEPHRINE (PF) 0.5% -1:200000 IJ SOLN
INTRAMUSCULAR | Status: AC
  Filled 2018-09-22: qty 30

## 2018-09-22 MED ORDER — GABAPENTIN 300 MG PO CAPS
300.0000 mg | ORAL_CAPSULE | ORAL | Status: AC
  Administered 2018-09-22: 300 mg via ORAL

## 2018-09-22 MED ORDER — ACETAMINOPHEN 500 MG PO TABS
1000.0000 mg | ORAL_TABLET | ORAL | Status: AC
  Administered 2018-09-22: 12:00:00 1000 mg via ORAL

## 2018-09-22 MED ORDER — DIAZEPAM 5 MG PO TABS: 5.0000 mg | ORAL_TABLET | Freq: Three times a day (TID) | ORAL | 0 refills | Status: DC | PRN

## 2018-09-22 MED ORDER — LIDOCAINE HCL (PF) 2 % IJ SOLN
INTRAMUSCULAR | Status: AC
  Filled 2018-09-22: qty 10

## 2018-09-22 SURGICAL SUPPLY — 28 items
BLADE SURG 15 STRL LF DISP TIS (BLADE) ×1 IMPLANT
BLADE SURG 15 STRL SS (BLADE) ×2
BRIEF STRETCH MATERNITY 2XLG (MISCELLANEOUS) ×3 IMPLANT
CANISTER SUCT 1200ML W/VALVE (MISCELLANEOUS) ×3 IMPLANT
COVER WAND RF STERILE (DRAPES) ×3 IMPLANT
DRAPE PERI LITHO V/GYN (MISCELLANEOUS) ×3 IMPLANT
DRAPE UNDER BUTTOCK W/FLU (DRAPES) ×3 IMPLANT
DRSG GAUZE FLUFF 36X18 (GAUZE/BANDAGES/DRESSINGS) ×3 IMPLANT
ELECT REM PT RETURN 9FT ADLT (ELECTROSURGICAL) ×3
ELECTRODE REM PT RTRN 9FT ADLT (ELECTROSURGICAL) ×1 IMPLANT
GLOVE BIOGEL PI IND STRL 7.0 (GLOVE) ×1 IMPLANT
GLOVE BIOGEL PI INDICATOR 7.0 (GLOVE) ×2
GLOVE SURG SYN 6.5 ES PF (GLOVE) ×3 IMPLANT
GOWN STRL REUS W/ TWL LRG LVL3 (GOWN DISPOSABLE) ×2 IMPLANT
GOWN STRL REUS W/TWL LRG LVL3 (GOWN DISPOSABLE) ×4
KIT TURNOVER CYSTO (KITS) ×3 IMPLANT
LABEL OR SOLS (LABEL) ×3 IMPLANT
NEEDLE HYPO 22GX1.5 SAFETY (NEEDLE) ×3 IMPLANT
NEEDLE HYPO 25X1 1.5 SAFETY (NEEDLE) ×3 IMPLANT
NS IRRIG 500ML POUR BTL (IV SOLUTION) ×3 IMPLANT
PACK BASIN MINOR ARMC (MISCELLANEOUS) ×3 IMPLANT
PAD PREP 24X41 OB/GYN DISP (PERSONAL CARE ITEMS) ×3 IMPLANT
SOL PREP PROV IODINE SCRUB 4OZ (MISCELLANEOUS) ×3 IMPLANT
SURGILUBE 2OZ TUBE FLIPTOP (MISCELLANEOUS) ×3 IMPLANT
SUT VIC AB 3-0 SH 27 (SUTURE) ×2
SUT VIC AB 3-0 SH 27X BRD (SUTURE) ×1 IMPLANT
SYR 10ML LL (SYRINGE) ×3 IMPLANT
SYR 20ML LL LF (SYRINGE) ×3 IMPLANT

## 2018-09-22 NOTE — Anesthesia Postprocedure Evaluation (Signed)
Anesthesia Post Note  Patient: Wendy Russell  Procedure(s) Performed: OPEN HEMORRHOIDECTOMY (N/A )  Patient location during evaluation: PACU Anesthesia Type: General Level of consciousness: awake and alert Pain management: pain level controlled Vital Signs Assessment: post-procedure vital signs reviewed and stable Respiratory status: spontaneous breathing and respiratory function stable Cardiovascular status: stable Anesthetic complications: no     Last Vitals:  Vitals:   09/22/18 1423 09/22/18 1426  BP:  132/81  Pulse: 62 71  Resp: 16 17  Temp:    SpO2: 100% 100%    Last Pain:  Vitals:   09/22/18 1423  PainSc: 4                  Tonnya Garbett K

## 2018-09-22 NOTE — Op Note (Signed)
Preoperative diagnosis: foruth degree hemorrhoids.   Postoperative diagnosis: fourth degree hemorrhoids, external anal skin tags  Procedure: exam under anesthesia,  hemorrhoidectomy.  Surgeon: Lysle Pearl  Anesthesia: general  Specimen: hemorrhoids x3  Complications: none  EBL: 30mL  Wound classification: Clean Contaminated  Indications: Patient is a 48 y.o. female was found to have symptomatic hemorrhoids refractory to medical management.   Findings: 1.  Fourth degree hemorrhoids and anal skin tags 2. Internal and external anal sphincter palpated and preserved 3. Adequate hemostasis  Description of procedure: The patient was brought to the operating room and general anesthesia was induced. Patient was placed high lithotomy position. A time-out was completed verifying correct patient, procedure, site, positioning, and implant(s) and/or special equipment prior to beginning this procedure. The perineum was prepped and draped in standard sterile fashion. Local anesthetic was injected as a perianal block. An anoscope was introduced and the two lateral hemorrhoidal pedicles were identified with fourth degree of hemorrhoids extending into moderate-sized anal skin tags.  A smaller skin tag was noted at the anterior portion as well.  A Kelly clamp was placed across the base of the left pedicle near the dentate line and retracted externally to exteriorize the hemorrhoidal pedicles.  3-0 vicryl was placed at the most interior aspect of the hemorrhoid base.  The excess hemorrhoid tissue then removed using 15 blade and passed off operative field pending pathology.  The clamp was removed and the previously placed 3-0 vicryl was used to close the open base in a running fashion, ensuring no sphincter muscle was sutured into the wound.     A Kelly clamp was placed across the base of the right pedicle near the dentate line and retracted externally to exteriorize the hemorrhoidal pedicles.  3-0 vicryl was  placed at the most interior aspect of the hemorrhoid base.  The excess hemorrhoid tissue then removed using 15 blade and passed off operative field pending pathology.  The clamp was removed and the previously placed 3-0 vicryl was used to close the open base in a running fashion, ensuring no sphincter muscle was sutured into the wound.   A Kelly clamp was placed across the base of the anterior skin tag near the dentate line and retracted.  3-0 vicryl was placed at the most interior aspect of the base.  The excess tissue then removed using 15 blade and passed off operative field pending pathology.  The clamp was removed and the previously placed 3-0 vicryl was used to close the open base in a running fashion, ensuring no sphincter muscle was sutured into the wound.   Hemostasis achieved with electrocautery in areas of bleeding until all active bleeding controlled.  Last inspection of the anal canal did not note any additional hemorrhoidal tissue and no other pathology. Gelfoam placed within rectum, and exparel injected as a perianal block. A gauze pad was tucked between the gluteal folds, and secured in place with mesh underwear.  The patient tolerated the procedure well and was taken to the postanesthesia care unit in stable condition.

## 2018-09-22 NOTE — Interval H&P Note (Signed)
History and Physical Interval Note:  09/22/2018 12:34 PM  Wendy Russell  has presented today for surgery, with the diagnosis of K64.1 GRADE II HEMORRHOIDS.  The various methods of treatment have been discussed with the patient and family. After consideration of risks, benefits and other options for treatment, the patient has consented to  Procedure(s): OPEN HEMORRHOIDECTOMY (N/A) as a surgical intervention.  The patient's history has been reviewed, patient examined, no change in status, stable for surgery.  I have reviewed the patient's chart and labs.  Questions were answered to the patient's satisfaction.     Braidon Chermak Lysle Pearl

## 2018-09-22 NOTE — Anesthesia Preprocedure Evaluation (Signed)
Anesthesia Evaluation  Patient identified by MRN, date of birth, ID band Patient awake    Reviewed: Allergy & Precautions, NPO status , Patient's Chart, lab work & pertinent test results  History of Anesthesia Complications (+) Family history of anesthesia reactionNegative for: history of anesthetic complications  Airway Mallampati: III       Dental   Pulmonary neg sleep apnea, neg COPD, Current Smoker and Patient abstained from smoking.,           Cardiovascular hypertension, (-) Past MI and (-) CHF (-) dysrhythmias (-) Valvular Problems/Murmurs     Neuro/Psych  Headaches, neg Seizures PSYCHIATRIC DISORDERS Anxiety    GI/Hepatic Neg liver ROS, GERD  Controlled,  Endo/Other  neg diabetes  Renal/GU negative Renal ROS     Musculoskeletal  (+) Arthritis , Osteoarthritis,    Abdominal   Peds  Hematology  (+) anemia ,   Anesthesia Other Findings Past Medical History: No date: Anemia     Comment:  H/O No date: Anxiety No date: Arthritis     Comment:  HANDS No date: Claustrophobia No date: Dizziness No date: Elevated blood pressure, situational     Comment:  ASS WITH MIGRAINES-NO MEDS No date: Family history of adverse reaction to anesthesia     Comment:  MOM AND SISTER-HARD TIME WAKING UP(MOM) AND UNUSURE OF               SISTERS REACTION No date: GERD (gastroesophageal reflux disease)     Comment:  OCC-TUMS PRN No date: Macrocytosis     Comment:  SEES HEMATOLOGIST DR [facility] No date: Migraines     Comment:  MIGRAINES  Reproductive/Obstetrics                             Anesthesia Physical  Anesthesia Plan  ASA: II  Anesthesia Plan: General   Post-op Pain Management:    Induction: Intravenous  PONV Risk Score and Plan: 2 and Dexamethasone and Ondansetron  Airway Management Planned: LMA and Oral ETT  Additional Equipment:   Intra-op Plan:   Post-operative  Plan:   Informed Consent: I have reviewed the patients History and Physical, chart, labs and discussed the procedure including the risks, benefits and alternatives for the proposed anesthesia with the patient or authorized representative who has indicated his/her understanding and acceptance.       Plan Discussed with: CRNA and Surgeon  Anesthesia Plan Comments:         Anesthesia Quick Evaluation

## 2018-09-22 NOTE — Anesthesia Procedure Notes (Signed)
Procedure Name: Intubation Performed by: Fredderick Phenix, CRNA Pre-anesthesia Checklist: Patient identified, Emergency Drugs available, Suction available and Patient being monitored Patient Re-evaluated:Patient Re-evaluated prior to induction Oxygen Delivery Method: Circle system utilized Preoxygenation: Pre-oxygenation with 100% oxygen Induction Type: IV induction Ventilation: Mask ventilation without difficulty Laryngoscope Size: Mac and 3 Grade View: Grade II Tube type: Oral Tube size: 7.0 mm Number of attempts: 1 Airway Equipment and Method: Stylet and Oral airway Placement Confirmation: ETT inserted through vocal cords under direct vision,  positive ETCO2 and breath sounds checked- equal and bilateral Secured at: 21 cm Tube secured with: Tape Dental Injury: Teeth and Oropharynx as per pre-operative assessment

## 2018-09-22 NOTE — Discharge Instructions (Signed)
AMBULATORY SURGERY  DISCHARGE INSTRUCTIONS   1) The drugs that you were given will stay in your system until tomorrow so for the next 24 hours you should not:  A) Drive an automobile B) Make any legal decisions C) Drink any alcoholic beverage   2) You may resume regular meals tomorrow.  Today it is better to start with liquids and gradually work up to solid foods.  You may eat anything you prefer, but it is better to start with liquids, then soup and crackers, and gradually work up to solid foods.   3) Please notify your doctor immediately if you have any unusual bleeding, trouble breathing, redness and pain at the surgery site, drainage, fever, or pain not relieved by medication.    4) Additional Instructions:please make your follow up appointment        Please contact your physician with any problems or Same Day Surgery at [phone], Monday through Friday 6 am to 4 pm, or [place] at [facility] number at [phone].Surgical Procedures for Hemorrhoids, Care After This sheet gives you information about how to care for yourself after your procedure. Your health care provider may also give you more specific instructions. If you have problems or questions, contact your health care provider. What can I expect after the procedure? After the procedure, it is common to have:  Rectal pain.  Pain when you are having a bowel movement.  Slight rectal bleeding. This is more likely to happen with the first bowel movement after surgery. Follow these instructions at home: Medicines  tylenol and advil as needed for discomfort.  Please alternate between the two every four hours as needed for pain.    Use narcotics, if prescribed, only when tylenol and motrin is not enough to control pain.  325-650mg  every 8hrs to max of 4000mg /24hrs (including the 325mg  in every norco dose) for the tylenol.    Advil up to 800mg  per dose every 8hrs as needed for pain.    Ask your health care  provider if the medicine prescribed to you requires you to avoid driving or using heavy machinery.  Use a stool softener or a bulk laxative as told by your health care provider. Eating and drinking  Follow instructions from your health care provider about what to eat or drink after your procedure.  You may need to take actions to prevent or treat constipation, such as: ? Drink enough fluid to keep your urine pale yellow. ? Take over-the-counter or prescription medicines. ? Eat foods that are high in fiber, such as beans, whole grains, and fresh fruits and vegetables. ? Limit foods that are high in fat and processed sugars, such as fried or sweet foods. Activity   Rest as told by your health care provider.  Avoid sitting for a long time without moving. Get up to take short walks every 1-2 hours. This is important to improve blood flow and breathing. Ask for help if you feel weak or unsteady.  Return to your normal activities as told by your health care provider. Ask your health care provider what activities are safe for you.  Do not lift anything that is heavier than 10 lb (4.5 kg), or the limit that you are told, until your health care provider says that it is safe.  Do not strain to have a bowel movement.  Do not spend a long time sitting on the toilet. General instructions   Take warm sitz baths for 15-20 minutes, 2-3 times a day to  relieve soreness or itching and to keep the rectal area clean.  Apply ice packs to the area to reduce swelling and pain.  Do not drive for 24 hours if you were given a sedative during your procedure.  Keep all follow-up visits as told by your health care provider. This is important. Contact a health care provider if:  Your pain medicine is not helping.  You have a fever or chills.  You have bad smelling drainage.  You have a lot of swelling.  You become constipated.  You have trouble passing urine. Get help right away if:  You have  very bad rectal pain.  You have heavy bleeding from your rectum. Summary  After the procedure, it is common to have pain and slight rectal bleeding.  Take warm sitz baths for 15-20 minutes, 2-3 times a day to relieve soreness or itching and to keep the rectal area clean.  Avoid straining when having a bowel movement.  Eat foods that are high in fiber, such as beans, whole grains, and fresh fruits and vegetables.  Take over-the-counter and prescription medicines only as told by your health care provider. This information is not intended to replace advice given to you by your health care provider. Make sure you discuss any questions you have with your health care provider. Document Released: 04/10/2003 Document Revised: 12/06/2017 Document Reviewed: 12/06/2017 Elsevier Patient Education  2020 [employer].

## 2018-09-22 NOTE — Anesthesia Post-op Follow-up Note (Signed)
Anesthesia QCDR form completed.        

## 2018-09-22 NOTE — Transfer of Care (Signed)
Immediate Anesthesia Transfer of Care Note  Patient: Wendy Russell  Procedure(s) Performed: OPEN HEMORRHOIDECTOMY (N/A )  Patient Location: PACU  Anesthesia Type:General  Level of Consciousness: awake, alert  and oriented  Airway & Oxygen Therapy: Patient Spontanous Breathing  Post-op Assessment: Report given to RN and Post -op Vital signs reviewed and stable  Post vital signs: Reviewed and stable  Last Vitals:  Vitals Value Taken Time  BP 107/64 09/22/18 1356  Temp 36.1 C 09/22/18 1356  Pulse 54 09/22/18 1400  Resp 23 09/22/18 1400  SpO2 100 % 09/22/18 1400  Vitals shown include unvalidated device data.  Last Pain:  Vitals:   09/22/18 1356  PainSc: Asleep         Complications: No apparent anesthesia complications

## 2018-09-24 ENCOUNTER — Encounter: Payer: Self-pay | Admitting: Surgery

## 2018-09-25 LAB — SURGICAL PATHOLOGY

## 2019-01-22 ENCOUNTER — Other Ambulatory Visit: Payer: Commercial Managed Care - PPO

## 2019-01-22 ENCOUNTER — Ambulatory Visit: Payer: Commercial Managed Care - PPO | Admitting: Oncology

## 2019-01-31 ENCOUNTER — Inpatient Hospital Stay: Payer: Commercial Managed Care - PPO

## 2019-01-31 ENCOUNTER — Inpatient Hospital Stay: Payer: Commercial Managed Care - PPO | Attending: Oncology

## 2019-01-31 ENCOUNTER — Other Ambulatory Visit: Payer: Self-pay

## 2019-01-31 DIAGNOSIS — Z90710 Acquired absence of both cervix and uterus: Secondary | ICD-10-CM | POA: Diagnosis not present

## 2019-01-31 DIAGNOSIS — D7589 Other specified diseases of blood and blood-forming organs: Secondary | ICD-10-CM

## 2019-01-31 DIAGNOSIS — Z791 Long term (current) use of non-steroidal anti-inflammatories (NSAID): Secondary | ICD-10-CM | POA: Diagnosis not present

## 2019-01-31 DIAGNOSIS — Z800 Family history of malignant neoplasm of digestive organs: Secondary | ICD-10-CM | POA: Diagnosis not present

## 2019-01-31 DIAGNOSIS — Z803 Family history of malignant neoplasm of breast: Secondary | ICD-10-CM | POA: Diagnosis not present

## 2019-01-31 DIAGNOSIS — Z79899 Other long term (current) drug therapy: Secondary | ICD-10-CM | POA: Insufficient documentation

## 2019-01-31 DIAGNOSIS — Z9079 Acquired absence of other genital organ(s): Secondary | ICD-10-CM | POA: Diagnosis not present

## 2019-01-31 DIAGNOSIS — F17210 Nicotine dependence, cigarettes, uncomplicated: Secondary | ICD-10-CM | POA: Insufficient documentation

## 2019-01-31 LAB — CBC WITH DIFFERENTIAL/PLATELET
Abs Immature Granulocytes: 0.02 10*3/uL (ref 0.00–0.07)
Basophils Absolute: 0 10*3/uL (ref 0.0–0.1)
Basophils Relative: 1 %
Eosinophils Absolute: 0.1 10*3/uL (ref 0.0–0.5)
Eosinophils Relative: 1 %
HCT: 42.8 % (ref 36.0–46.0)
Hemoglobin: 14.7 g/dL (ref 12.0–15.0)
Immature Granulocytes: 0 %
Lymphocytes Relative: 23 %
Lymphs Abs: 1.8 10*3/uL (ref 0.7–4.0)
MCH: 35.1 pg — ABNORMAL HIGH (ref 26.0–34.0)
MCHC: 34.3 g/dL (ref 30.0–36.0)
MCV: 102.1 fL — ABNORMAL HIGH (ref 80.0–100.0)
Monocytes Absolute: 0.4 10*3/uL (ref 0.1–1.0)
Monocytes Relative: 5 %
Neutro Abs: 5.5 10*3/uL (ref 1.7–7.7)
Neutrophils Relative %: 70 %
Platelets: 294 10*3/uL (ref 150–400)
RBC: 4.19 MIL/uL (ref 3.87–5.11)
RDW: 13 % (ref 11.5–15.5)
WBC: 7.9 10*3/uL (ref 4.0–10.5)
nRBC: 0 % (ref 0.0–0.2)

## 2019-02-01 ENCOUNTER — Encounter: Payer: Self-pay | Admitting: Oncology

## 2019-02-01 ENCOUNTER — Inpatient Hospital Stay (HOSPITAL_BASED_OUTPATIENT_CLINIC_OR_DEPARTMENT_OTHER): Payer: Commercial Managed Care - PPO | Admitting: Oncology

## 2019-02-01 DIAGNOSIS — D7589 Other specified diseases of blood and blood-forming organs: Secondary | ICD-10-CM

## 2019-02-01 NOTE — Progress Notes (Signed)
Patient stated that she had been having pain on her bilateral feet and right knee. Patient stated that she also has headaches everyday in the AM. Patient stated that she takes a hot shower and Ibuprofen/Tylenol and the headache go away. Patient stated that she will schedule an appointment with her neurologist soon.

## 2019-02-05 NOTE — Progress Notes (Signed)
I connected with Wendy Russell on 02/05/19 at 11:30 AM EST by video enabled telemedicine visit and verified that I am speaking with the correct person using two identifiers.   I discussed the limitations, risks, security and privacy concerns of performing an evaluation and management service by telemedicine and the availability of in-person appointments. I also discussed with the patient that there may be a patient responsible charge related to this service. The patient expressed understanding and agreed to proceed.  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 She recently had labs drawn on 06/28/2018 which showed a white count of 7.2, H&H of 14.3/41.1 with an MCV of 103 and a platelet count of 237. Of note patient has had macrocytosis with an MCV ranging from 101 203 since October 2019. Folate levels were normal in January 2020. Thyroid levels were normal at that time. B12 levels were low at 181 in January 2020 but normalized to 464 in May 2020.Last CMP from January 2020 was within normal limits. She has remote h/o alcohol intake but currently states she has a glass of wine 2-3 times ayear. No known liver disease  She has h/o migraines and was on nortryptiline. It made her dizzy and now she is coming off the medication. Denies any OTC meds or herbal meds. No recent changes in her medications  Results of blood work from 07/12/2018 were as follows: CBC with differential showed white count of 6.9, H&H of 14.3/40 with an MCV of 98.5 and a platelet count of 296.  Multiple myeloma panel showed no M protein.  Kappa was slightly elevated at 21.1 but free light chain ratio was  normal at 1.34.  Reticulocyte count was normal at 2.7.  Smear review was unremarkable.   Interval history: Patient reports doing well. Denies any complaints at this time   Review of Systems  Constitutional: Negative for chills, fever, malaise/fatigue and weight loss.  HENT: Negative for congestion, ear discharge and nosebleeds.   Eyes: Negative for blurred vision.  Respiratory: Negative for cough, hemoptysis, sputum production, shortness of breath and wheezing.   Cardiovascular: Negative for chest pain, palpitations, orthopnea and claudication.  Gastrointestinal: Negative for abdominal pain, blood in stool, constipation, diarrhea, heartburn, melena, nausea and vomiting.  Genitourinary: Negative for dysuria, flank pain, frequency, hematuria and urgency.  Musculoskeletal: Negative for back pain, joint pain and myalgias.  Skin: Negative for rash.  Neurological: Negative for dizziness, tingling, focal weakness, seizures, weakness and headaches.  Endo/Heme/Allergies: Does not bruise/bleed easily.  Psychiatric/Behavioral: Negative for depression and suicidal ideas. The patient does not have insomnia.     Allergies  Allergen Reactions  . Codeine Anaphylaxis  . Aspirin Other (See Comments)    Nose bleeds  . Doxycycline Nausea And Vomiting  . Tramadol     Tingling in face    Past Medical History:  Diagnosis Date  . Anemia    H/O  . Anxiety   . Arthritis    HANDS  . Claustrophobia   . Dizziness   . Elevated blood pressure, situational    ASS WITH MIGRAINES-NO MEDS  . Family history of adverse reaction to anesthesia    MOM AND SISTER-HARD TIME WAKING UP(MOM) AND UNUSURE OF SISTERS REACTION  .   GERD (gastroesophageal reflux disease)    OCC-TUMS PRN  . Macrocytosis    [facility] HEMATOLOGIST DR [facility]iel [facility]  . Migraines    MIGRAINES    Past Surgical History:  Procedure Laterality Date  . [place] WITH NOVASURE ABLATION  N/A 07/08/2017   Procedure: DILATATION & CURETTAGE/HYSTEROSCOPY WITH NOVASURE ABLATION;  Surgeon: Schermerhorn, Gwen Her, MD;  Location: [facility] ORS;  Service: Gynecology;  Laterality: N/A;  . HEMORRHOID SURGERY N/A 09/22/2018   Procedure: OPEN HEMORRHOIDECTOMY;  Surgeon: Benjamine Sprague, DO;  Location: [facility] ORS;  Service: General;  Laterality: N/A;  . LAPAROSCOPIC BILATERAL SALPINGECTOMY Bilateral 11/22/2017   Procedure: LAPAROSCOPIC BILATERAL SALPINGECTOMY;  Surgeon: Boykin Nearing, MD;  Location: [facility] OR[facility];  Service: Gynecology;  Laterality: Bilateral;  . LAPAROSCOPIC SUPRACERVICAL HYSTERECTOMY N/A 11/22/2017   Procedure: LAPAROSCOPIC SUPRACERVICAL HYSTERECTOMY;  Surgeon: Schermerhorn, Gwen Her, MD;  Location: [facility] OR[facility];  Service: Gynecology;  Laterality: N/A;  . TUBAL LIGATION  1993  . WISDOM TOOTH EXTRACTION      Social History   Socioeconomic History  . Marital status: Married    Spouse name: Not on file  . Number of children: Not on file  . Years of education: Not on file  . Highest education level: Not on file  Occupational History  . Not on file  Tobacco Use  . Smoking status: Current Every Day Smoker    Packs/day: 0.25    Years: 23.00    Pack years: 5.75    Types: Cigarettes  . Smokeless tobacco: Never Used  . Tobacco comment: now to 6 cig. a day and dec. by 1 cig every 3 days  Substance and Sexual Activity  . Alcohol use: Yes    Comment: RARE WINE  . Drug use: No  . Sexual activity: Yes  Other Topics Concern  . Not on file  Social History Narrative  . Not on file   Social Determinants of Health   Financial Resource Strain:   . Difficulty of Paying Living Expenses: Not on file  Food Insecurity:   . Worried About Charity fundraiser in the Last Year: Not on file  . Ran Out of Food in the Last Year: Not on file  Transportation Needs:   . Lack of Transportation (Medical): Not on file  . Lack of Transportation (Non-Medical): Not on file  Physical Activity:   .  Days of Exercise per Week: Not on file  . Minutes of Exercise per Session: Not on file  Stress:   . Feeling of Stress : Not on file  Social Connections:   . Frequency of Communication with Friends and Family: Not on file  . Frequency of Social Gatherings with Friends and Family: Not on file  . Attends Religious Services: Not on file  . Active Member of Clubs or Organizations: Not on file  . Attends Archivist Meetings: Not on file  . Marital Status: Not on file  Intimate Partner Violence:   . Fear of Current or Ex-Partner: Not on file  . Emotionally Abused: Not on file  . Physically Abused: Not on file  . Sexually Abused: Not on file    Family History  Problem Relation Age of Onset  . Breast cancer Mother 77  . Stomach cancer Maternal Aunt      Current Outpatient Medications:  .  acetaminophen (TYLENOL) 500 MG tablet, Take 500 mg by mouth every 6 (six) hours as needed., Disp: ,  Rfl:  .  azelastine (ASTELIN) 0.1 % nasal spray, Place 1 spray into both nostrils 2 (two) times daily. Use in each nostril as directed, Disp: , Rfl:  .  ibuprofen (ADVIL) 800 MG tablet, Take 1 tablet (800 mg total) by mouth every 8 (eight) hours as needed., Disp: 30 tablet, Rfl: 0 .  vitamin B-12 (CYANOCOBALAMIN) 500 MCG tablet, Take 500 mcg by mouth daily., Disp: , Rfl:  .  calcium carbonate (TUMS - DOSED IN MG ELEMENTAL CALCIUM) 500 MG chewable tablet, Chew 1 tablet by mouth as needed for indigestion or heartburn., Disp: , Rfl:  .  solifenacin (VESICARE) 5 MG tablet, Take 1 tablet (5 mg total) by mouth daily. (Patient not taking: Reported on 02/01/2019), Disp: 30 tablet, Rfl: 11 .  SUMAtriptan (IMITREX) 100 MG tablet, Take 100 mg by mouth every 2 (two) hours as needed for migraine. , Disp: , Rfl:   No results found.  No images are attached to the encounter.   CMP Latest Ref Rng & Units 11/29/2017  Glucose 70 - 99 mg/dL 110(H)  BUN 6 - 20 mg/dL 12  Creatinine 0.44 - 1.00 mg/dL 0.74  Sodium  135 - 145 mmol/L 138  Potassium 3.5 - 5.1 mmol/L 3.8  Chloride 98 - 111 mmol/L 103  CO2 22 - 32 mmol/L 26  Calcium 8.9 - 10.3 mg/dL 9.0  Total Protein 6.5 - 8.1 g/dL 7.7  Total Bilirubin 0.3 - 1.2 mg/dL 0.6  Alkaline Phos 38 - 126 U/L 52  AST 15 - 41 U/L 23  ALT 0 - 44 U/L 24   CBC Latest Ref Rng & Units 01/31/2019  WBC 4.0 - 10.5 K/uL 7.9  Hemoglobin 12.0 - 15.0 g/dL 14.7  Hematocrit 36.0 - 46.0 % 42.8  Platelets 150 - 400 K/uL 294     Observation/objective: Appears in no acute distress of a video visit today.  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  This is a routine follow-up visit  Patient has had macrocytosis with an MCV ranging between 99 202 over the last 3 years.  White cell hemoglobin and platelets have been normal. TSH B12 folate was normal and liver enzymes were also normal.  Multiple myeloma panel did not reveal any M protein serum free kappa light chains were mildly elevated at 21 with a normal kappa lambda light chain ratio 1.3.  Cause of her macrocytosis is unclear but has not led to any cytopenias.  This can be Monitored conservatively without any further work-up at this time.  Patient wanted to know if she could go back to donating blood which she can  Follow-up instructions: Repeat CBC with differential and serum free light chains in 6 months in 1 year and I will see her back in 1 years time  I discussed the assessment and treatment plan with the patient. The patient was provided an opportunity to ask questions and all were answered. The patient agreed with the plan and demonstrated an understanding of the instructions.   The patient was advised to call back or seek an in-person evaluation if the symptoms worsen or if the condition fails to improve as anticipated.   Visit Diagnosis: 1. Macrocytosis 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     Dr. Randa Evens, MD, MPH [facility] [phone] 02/05/2019 11:06 AM

## 2019-07-27 ENCOUNTER — Ambulatory Visit: Payer: Self-pay | Admitting: Urology

## 2019-08-01 ENCOUNTER — Inpatient Hospital Stay: Payer: Commercial Managed Care - PPO | Attending: Oncology

## 2019-08-01 ENCOUNTER — Other Ambulatory Visit: Payer: Self-pay

## 2019-08-01 DIAGNOSIS — D7589 Other specified diseases of blood and blood-forming organs: Secondary | ICD-10-CM

## 2019-08-01 LAB — CBC WITH DIFFERENTIAL/PLATELET
Abs Immature Granulocytes: 0.04 10*3/uL (ref 0.00–0.07)
Basophils Absolute: 0 10*3/uL (ref 0.0–0.1)
Basophils Relative: 1 %
Eosinophils Absolute: 0.2 10*3/uL (ref 0.0–0.5)
Eosinophils Relative: 3 %
HCT: 43.7 % (ref 36.0–46.0)
Hemoglobin: 15.4 g/dL — ABNORMAL HIGH (ref 12.0–15.0)
Immature Granulocytes: 1 %
Lymphocytes Relative: 28 %
Lymphs Abs: 2.5 10*3/uL (ref 0.7–4.0)
MCH: 35.3 pg — ABNORMAL HIGH (ref 26.0–34.0)
MCHC: 35.2 g/dL (ref 30.0–36.0)
MCV: 100.2 fL — ABNORMAL HIGH (ref 80.0–100.0)
Monocytes Absolute: 0.4 10*3/uL (ref 0.1–1.0)
Monocytes Relative: 4 %
Neutro Abs: 5.6 10*3/uL (ref 1.7–7.7)
Neutrophils Relative %: 63 %
Platelets: 230 10*3/uL (ref 150–400)
RBC: 4.36 MIL/uL (ref 3.87–5.11)
RDW: 12.8 % (ref 11.5–15.5)
WBC: 8.7 10*3/uL (ref 4.0–10.5)
nRBC: 0 % (ref 0.0–0.2)

## 2019-08-02 LAB — KAPPA/LAMBDA LIGHT CHAINS
Kappa free light chain: 21.8 mg/L — ABNORMAL HIGH (ref 3.3–19.4)
Kappa, lambda light chain ratio: 1.37 (ref 0.26–1.65)
Lambda free light chains: 15.9 mg/L (ref 5.7–26.3)

## 2019-10-03 DIAGNOSIS — U071 COVID-19: Secondary | ICD-10-CM

## 2019-10-03 HISTORY — DX: COVID-19: U07.1

## 2019-10-08 ENCOUNTER — Encounter: Payer: Self-pay | Admitting: Emergency Medicine

## 2019-10-08 ENCOUNTER — Other Ambulatory Visit: Payer: Self-pay

## 2019-10-08 ENCOUNTER — Emergency Department: Payer: Commercial Managed Care - PPO

## 2019-10-08 ENCOUNTER — Emergency Department
Admission: EM | Admit: 2019-10-08 | Discharge: 2019-10-08 | Disposition: A | Discharge status: Home / Self Care | Payer: Commercial Managed Care - PPO | Attending: Emergency Medicine | Admitting: Emergency Medicine

## 2019-10-08 DIAGNOSIS — F17210 Nicotine dependence, cigarettes, uncomplicated: Secondary | ICD-10-CM | POA: Insufficient documentation

## 2019-10-08 DIAGNOSIS — U071 COVID-19: Secondary | ICD-10-CM | POA: Diagnosis not present

## 2019-10-08 DIAGNOSIS — J1282 Pneumonia due to coronavirus disease 2019: Secondary | ICD-10-CM

## 2019-10-08 DIAGNOSIS — R05 Cough: Secondary | ICD-10-CM | POA: Diagnosis present

## 2019-10-08 LAB — SARS CORONAVIRUS 2 BY RT PCR (HOSPITAL ORDER, PERFORMED IN ~~LOC~~ HOSPITAL LAB): SARS Coronavirus 2: POSITIVE — AB

## 2019-10-08 NOTE — ED Triage Notes (Signed)
C/O cough and bodyaches x 1 week.  STates was tested for COVID last Tuesday, negative result.    AAOx3.  Skin warm and dry.  Non productive cough noted in triage.

## 2019-10-08 NOTE — ED Notes (Signed)
See triage note  Presents with cough and body aches   Low grade temp on  States she was tested for COVID last week and it was negative

## 2019-10-08 NOTE — ED Provider Notes (Signed)
[facility] Emergency Department Provider Note   ____________________________________________   First MD Initiated Contact with Patient 10/08/19 1426     (approximate)  I have reviewed the triage vital signs and the nursing notes.   HISTORY  Chief Complaint Cough and Generalized Body Aches   HPI Wendy Russell is a 49 y.o. female presents to the ED with complaint of cough and body aches along with low-grade temp.  Patient states that she was tested last week for Covid and was negative.  She denies any difficulty breathing.  She is not aware of any direct contact with Covid.  Patient is not vaccinated.       Past Medical History:  Diagnosis Date  . Anemia    H/O  . Anxiety   . Arthritis    HANDS  . Claustrophobia   . Dizziness   . Elevated blood pressure, situational    ASS WITH MIGRAINES-NO MEDS  . Family history of adverse reaction to anesthesia    MOM AND SISTER-HARD TIME WAKING UP(MOM) AND UNUSURE OF SISTERS REACTION  . GERD (gastroesophageal reflux disease)    OCC-TUMS PRN  . Macrocytosis    SEES HEMATOLOGIST DR RAO IN [facility]  . Migraines    MIGRAINES    Patient Active Problem List   Diagnosis Date Noted  . Left lower quadrant pain 07/26/2018  . Headache disorder 04/03/2018  . Post-operative state 11/22/2017  . Healthcare maintenance 06/17/2017  . Migraine without aura and without status migrainosus, not intractable 12/08/2015  . Anxiety, generalized 12/05/2015    Past Surgical History:  Procedure Laterality Date  . [place] WITH NOVASURE ABLATION N/A 07/08/2017   Procedure: DILATATION & CURETTAGE/HYSTEROSCOPY WITH NOVASURE ABLATION;  Surgeon: Schermerhorn, Gwen Her, MD;  Location: [facility] ORS;  Service: Gynecology;  Laterality: N/A;  . HEMORRHOID SURGERY N/A 09/22/2018   Procedure: OPEN HEMORRHOIDECTOMY;  Surgeon: Benjamine Sprague, DO;  Location: [facility] ORS;  Service: General;   Laterality: N/A;  . LAPAROSCOPIC BILATERAL SALPINGECTOMY Bilateral 11/22/2017   Procedure: LAPAROSCOPIC BILATERAL SALPINGECTOMY;  Surgeon: Boykin Nearing, MD;  Location: [facility] ORS;  Service: Gynecology;  Laterality: Bilateral;  . LAPAROSCOPIC SUPRACERVICAL HYSTERECTOMY N/A 11/22/2017   Procedure: LAPAROSCOPIC SUPRACERVICAL HYSTERECTOMY;  Surgeon: Schermerhorn, Gwen Her, MD;  Location: [facility] ORS;  Service: Gynecology;  Laterality: N/A;  . TUBAL LIGATION  1993  . WISDOM TOOTH EXTRACTION      Prior to Admission medications   Medication Sig Start Date End Date Taking? Authorizing Provider  acetaminophen (TYLENOL) 500 MG tablet Take 500 mg by mouth every 6 (six) hours as needed.    [provider]  azelastine (ASTELIN) 0.1 % nasal spray Place 1 spray into both nostrils 2 (two) times daily. Use in each nostril as directed    [provider]  calcium carbonate (TUMS - DOSED IN MG ELEMENTAL CALCIUM) 500 MG chewable tablet Chew 1 tablet by mouth as needed for indigestion or heartburn.    [provider]  ibuprofen (ADVIL) 800 MG tablet Take 1 tablet (800 mg total) by mouth every 8 (eight) hours as needed. 09/22/18   Lysle Pearl, Isami, DO  SUMAtriptan (IMITREX) 100 MG tablet Take 100 mg by mouth every 2 (two) hours as needed for migraine.  06/18/18   [provider]  vitamin B-12 (CYANOCOBALAMIN) 500 MCG tablet Take 500 mcg by mouth daily.    [provider]    Allergies Codeine, Aspirin, Doxycycline, and  Tramadol  Family History  Problem Relation Age of Onset  . Breast cancer Mother 54  . Stomach cancer Maternal Aunt     Social History Social History   Tobacco Use  . Smoking status: Current Every Day Smoker    Packs/day: 0.25    Years: 23.00    Pack years: 5.75    Types: Cigarettes  . Smokeless tobacco: Never Used  . Tobacco comment: now to 6 cig. a day and dec. by 1 cig every 3 days  Vaping Use  . Vaping Use: Never used  Substance Use  Topics  . Alcohol use: Yes    Comment: RARE WINE  . Drug use: No    Review of Systems Constitutional: Positive fever/chills Eyes: No visual changes. ENT: No sore throat. Cardiovascular: Denies chest pain. Respiratory: Denies shortness of breath.  Positive for cough. Gastrointestinal: No abdominal pain.  No nausea, no vomiting.  No diarrhea.  No constipation. Genitourinary: Negative for dysuria. Musculoskeletal: Positive for muscle aches. Skin: Negative for rash. Neurological: Negative for headaches, focal weakness or numbness.  ____________________________________________   PHYSICAL EXAM:  VITAL SIGNS: ED Triage Vitals  Enc Vitals Group     BP 10/08/19 1314 (!) 112/59     Pulse Rate 10/08/19 1312 91     Resp 10/08/19 1312 (!) 22     Temp 10/08/19 1314 99.6 F (37.6 C)     Temp Source 10/08/19 1312 Oral     SpO2 10/08/19 1312 94 %     Weight 10/08/19 1313 160 lb 15 oz (73 kg)     Height 10/08/19 1313 4\' 11"  (1.499 m)     Head Circumference --      Peak Flow --      Pain Score 10/08/19 1312 0     Pain Loc --      Pain Edu? --      Excl. in [place]? --     Constitutional: Alert and oriented. Well appearing and in no acute distress. Eyes: Conjunctivae are normal.  Head: Atraumatic. Nose: No congestion/rhinnorhea. Neck: No stridor.   Cardiovascular: Normal rate, regular rhythm. Grossly normal heart sounds.  Good peripheral circulation. Respiratory: Normal respiratory effort.  No retractions. Lungs CTAB. Gastrointestinal: Soft and nontender. No distention.  Musculoskeletal: Moves upper and lower extremities with any difficulty.  Normal gait was noted. Neurologic:  Normal speech and language. No gross focal neurologic deficits are appreciated. No gait instability. Skin:  Skin is warm, dry and intact. No rash noted. Psychiatric: Mood and affect are normal. Speech and behavior are normal.  ____________________________________________   LABS (all labs ordered are listed,  but only abnormal results are displayed)  Labs Reviewed  SARS CORONAVIRUS 2 BY RT PCR (HOSPITAL ORDER, [place] [facility]) - Abnormal; Notable for the following components:      Result Value   SARS Coronavirus 2 POSITIVE (*)    All other components within normal limits    RADIOLOGY   Official radiology report(s): DG Chest Port 1 View  Result Date: 10/08/2019 CLINICAL DATA:  Cough.  Fever.  COVID-19 negative last Tuesday. EXAM: PORTABLE CHEST 1 VIEW COMPARISON:  None. FINDINGS: AP portable radiograph. Patient rotated left. Normal heart size. No pleural effusion or pneumothorax. Basilar predominant patchy opacities, somewhat linear in morphology. IMPRESSION: Bibasilar somewhat linear pulmonary opacities. Especially on the left, favor atelectasis or scarring. At the right lung base, especially without priors for comparison, pneumonia cannot be excluded. Electronically Signed   By: Abigail Miyamoto  M.D.   On: 10/08/2019 15:39    ____________________________________________   PROCEDURES  Procedure(s) performed (including Critical Care):  Procedures   ____________________________________________   INITIAL IMPRESSION / ASSESSMENT AND PLAN / ED COURSE  As part of my medical decision making, I reviewed the following data within the electronic MEDICAL RECORD NUMBER Notes from prior ED visits and [place] Controlled Substance Database  Wendy Russell was evaluated in Emergency Department on 10/08/2019 for the symptoms described in the history of present illness. She was evaluated in the context of the global COVID-19 pandemic, which necessitated consideration that the patient might be at risk for infection with the SARS-CoV-2 virus that causes COVID-19. Institutional protocols and algorithms that pertain to the evaluation of patients at risk for COVID-19 are in a state of rapid change based on information released by regulatory bodies including the CDC and federal and state organizations.  These policies and algorithms were followed during the patient's care in the ED.  49 year old female presents to the ED with complaint of body aches, cough and low-grade temp.  She has been tested last week for Covid and was negative.  Chest x-ray shows bilateral opacities that suggest pneumonia.  Covid test was positive and patient was made aware.  Discharge information included information about the Covid 19 infusion treatment.  Patient will continue drinking fluids, Tylenol or ibuprofen for fever and body aches and she was encouraged to walk frequently and take deep breaths.  Patient is aware that she should return to the emergency department if any severe worsening of her breathing or shortness of breath.  ____________________________________________   FINAL CLINICAL IMPRESSION(S) / ED DIAGNOSES  Final diagnoses:  COVID-19 virus infection  Pneumonia due to COVID-19 virus     ED Discharge Orders    None       Note:  This document was prepared using Dragon voice recognition software and may include unintentional dictation errors.    Johnn Hai, PA-C 10/08/19 1804    Harvest Dark, MD 10/18/19 1510

## 2019-10-08 NOTE — Discharge Instructions (Addendum)
Return to the emergency department if any shortness of breath or difficulty breathing.  Your Covid test today was positive.  You also have pneumonia on your chest x-ray which is due to the Covid infection.  Continue to increase fluids.  Tylenol or ibuprofen as needed for body aches, fever or headache.  You should be receiving a phone call from the infusion treatment center.  Their phone number is also listed on your discharge papers if you prefer to give them a phone call.  Also quarantine for approximately 7-10 more days.

## 2019-10-08 NOTE — ED Notes (Signed)
Rhonda PA notified of + COVID, no new orders at this time

## 2019-10-09 ENCOUNTER — Telehealth: Payer: Self-pay | Admitting: Oncology

## 2019-10-09 ENCOUNTER — Other Ambulatory Visit: Payer: Self-pay | Admitting: Oncology

## 2019-10-09 ENCOUNTER — Encounter: Payer: Self-pay | Admitting: Oncology

## 2019-10-09 ENCOUNTER — Ambulatory Visit (HOSPITAL_COMMUNITY)
Admission: RE | Admit: 2019-10-09 | Discharge: 2019-10-09 | Disposition: A | Discharge status: Home / Self Care | Payer: Commercial Managed Care - PPO | Source: Ambulatory Visit | Attending: Pulmonary Disease | Admitting: Pulmonary Disease

## 2019-10-09 DIAGNOSIS — U071 COVID-19: Secondary | ICD-10-CM | POA: Insufficient documentation

## 2019-10-09 MED ORDER — SODIUM CHLORIDE 0.9 % IV SOLN: INTRAVENOUS | Status: DC | PRN

## 2019-10-09 MED ORDER — EPINEPHRINE 0.3 MG/0.3ML IJ SOAJ: 0.3000 mg | Freq: Once | INTRAMUSCULAR | Status: DC | PRN

## 2019-10-09 MED ORDER — FAMOTIDINE IN NACL 20-0.9 MG/50ML-% IV SOLN: 20.0000 mg | Freq: Once | INTRAVENOUS | Status: DC | PRN

## 2019-10-09 MED ORDER — SODIUM CHLORIDE 0.9 % IV SOLN
1200.0000 mg | Freq: Once | INTRAVENOUS | Status: AC
  Administered 2019-10-09: 1200 mg via INTRAVENOUS
  Filled 2019-10-09: qty 10

## 2019-10-09 MED ORDER — METHYLPREDNISOLONE SODIUM SUCC 125 MG IJ SOLR: 125.0000 mg | Freq: Once | INTRAMUSCULAR | Status: DC | PRN

## 2019-10-09 MED ORDER — DIPHENHYDRAMINE HCL 50 MG/ML IJ SOLN: 50.0000 mg | Freq: Once | INTRAMUSCULAR | Status: DC | PRN

## 2019-10-09 MED ORDER — ALBUTEROL SULFATE HFA 108 (90 BASE) MCG/ACT IN AERS: 2.0000 | INHALATION_SPRAY | Freq: Once | RESPIRATORY_TRACT | Status: DC | PRN

## 2019-10-09 NOTE — Telephone Encounter (Signed)
I connected by phone with Mrs. Paget to discuss the potential use of an new treatment for mild to moderate COVID-19 viral infection in non-hospitalized patients.   This patient is a age/sex that meets the FDA criteria for Emergency Use Authorization of casirivimab\imdevimab.  Has a (+) direct SARS-CoV-2 viral test result 1. Has mild or moderate COVID-19  2. Is ? 51 years of age and weighs ? 40 kg 3. Is NOT hospitalized due to COVID-19 4. Is NOT requiring oxygen therapy or requiring an increase in baseline oxygen flow rate due to COVID-19 5. Is within 10 days of symptom onset 6. Has at least one of the high risk factor(s) for progression to severe COVID-19 and/or hospitalization as defined in EUA. ? Specific high risk criteria : Smoker-COPD   Symptom onset  10/02/19   I have spoken and communicated the following to the patient or parent/caregiver:   1. FDA has authorized the emergency use of casirivimab\imdevimab for the treatment of mild to moderate COVID-19 in adults and pediatric patients with positive results of direct SARS-CoV-2 viral testing who are 71 years of age and older weighing at least 40 kg, and who are at high risk for progressing to severe COVID-19 and/or hospitalization.   2. The significant known and potential risks and benefits of casirivimab\imdevimab, and the extent to which such potential risks and benefits are unknown.   3. Information on available alternative treatments and the risks and benefits of those alternatives, including clinical trials.   4. Patients treated with casirivimab\imdevimab should continue to self-isolate and use infection control measures (e.g., wear mask, isolate, social distance, avoid sharing personal items, clean and disinfect "high touch" surfaces, and frequent handwashing) according to CDC guidelines.    5. The patient or parent/caregiver has the option to accept or refuse casirivimab\imdevimab .   After reviewing this information with the  patient, The patient agreed to proceed with receiving casirivimab\imdevimab infusion and will be provided a copy of the Fact sheet prior to receiving the infusion.Rulon Abide, AGNP-C [phone] ([place])

## 2019-10-09 NOTE — Discharge Instructions (Signed)

## 2020-02-04 ENCOUNTER — Encounter: Payer: Self-pay | Admitting: Oncology

## 2020-02-04 ENCOUNTER — Inpatient Hospital Stay: Payer: Commercial Managed Care - PPO | Admitting: Oncology

## 2020-02-04 ENCOUNTER — Inpatient Hospital Stay: Payer: Commercial Managed Care - PPO | Attending: Oncology

## 2020-02-04 ENCOUNTER — Other Ambulatory Visit: Payer: Self-pay

## 2020-02-04 VITALS — BP 122/79 | HR 65 | Temp 97.3°F

## 2020-02-04 DIAGNOSIS — D7589 Other specified diseases of blood and blood-forming organs: Secondary | ICD-10-CM | POA: Insufficient documentation

## 2020-02-04 DIAGNOSIS — Z79899 Other long term (current) drug therapy: Secondary | ICD-10-CM | POA: Diagnosis not present

## 2020-02-04 DIAGNOSIS — F17210 Nicotine dependence, cigarettes, uncomplicated: Secondary | ICD-10-CM | POA: Insufficient documentation

## 2020-02-04 DIAGNOSIS — Z803 Family history of malignant neoplasm of breast: Secondary | ICD-10-CM | POA: Diagnosis not present

## 2020-02-04 DIAGNOSIS — Z800 Family history of malignant neoplasm of digestive organs: Secondary | ICD-10-CM | POA: Diagnosis not present

## 2020-02-04 LAB — CBC WITH DIFFERENTIAL/PLATELET
Abs Immature Granulocytes: 0.03 10*3/uL (ref 0.00–0.07)
Basophils Absolute: 0 10*3/uL (ref 0.0–0.1)
Basophils Relative: 1 %
Eosinophils Absolute: 0.1 10*3/uL (ref 0.0–0.5)
Eosinophils Relative: 1 %
HCT: 40.8 % (ref 36.0–46.0)
Hemoglobin: 14.7 g/dL (ref 12.0–15.0)
Immature Granulocytes: 1 %
Lymphocytes Relative: 30 %
Lymphs Abs: 1.8 10*3/uL (ref 0.7–4.0)
MCH: 34.1 pg — ABNORMAL HIGH (ref 26.0–34.0)
MCHC: 36 g/dL (ref 30.0–36.0)
MCV: 94.7 fL (ref 80.0–100.0)
Monocytes Absolute: 0.4 10*3/uL (ref 0.1–1.0)
Monocytes Relative: 7 %
Neutro Abs: 3.7 10*3/uL (ref 1.7–7.7)
Neutrophils Relative %: 60 %
Platelets: 240 10*3/uL (ref 150–400)
RBC: 4.31 MIL/uL (ref 3.87–5.11)
RDW: 12.7 % (ref 11.5–15.5)
WBC: 6.1 10*3/uL (ref 4.0–10.5)
nRBC: 0 % (ref 0.0–0.2)

## 2020-02-04 NOTE — Progress Notes (Signed)
Pt says intake is good. Bowels are good. resp- sob on a lot of exertion- she feels that it comes from the covid she had in sept. . She states she has numbness of right arm and shoulder at times. She also feels pain at times in lower back. Feels like numbness and tingling rigt foot and shin at times. According to her phone app she does not get good sleep

## 2020-02-04 NOTE — Progress Notes (Signed)
Hematology/Oncology Consult note [facility]  [phone] [phone]  Patient Care Team: Kirk Ruths, MD as PCP - General (Internal Medicine) Sindy Guadeloupe, MD as Consulting Physician (Oncology) Sindy Guadeloupe, MD as Consulting Physician (Oncology)   Name of the patient: Wendy Russell  [id number]  1970/03/29   Date of visit: 02/04/20  Diagnosis-macrocytosis 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  Chief complaint/ Reason for visit-routine follow-up of macrocytosis  Heme/Onc history: Patient is a 50 year old female with a past medical history significant forAnxiety, migraine among other medical problems. She recently had labs drawn on 06/28/2018 which showed a white count of 7.2, H&H of 14.3/41.1 with an MCV of 103 and a platelet count of 237. Of note patient has had macrocytosis with an MCV ranging from 101 203 since October 2019. Folate levels were normal in January 2020. Thyroid levels were normal at that time. B12 levels were low at 181 in January 2020 but normalized to 464 in May 2020.Last CMP from January 2020 was within normal limits. She has remote h/o alcohol intake but currently states she has a glass of wine 2-3 times ayear. No known liver disease  She has h/o migraines and was on nortryptiline. It made her dizzy and now she is coming off the medication. Denies any OTC meds or herbal meds. No recent changes in her medications  Results of blood work from 07/12/2018 were as follows: CBC with differential showed white count of 6.9, H&H of 14.3/40 with an MCV of 98.5 and a platelet count of 296. Multiple myeloma panel showed no M protein. Kappa was slightly elevated at 21.1 but free light chain ratio was normal at 1.34. Reticulocyte count was normal at 2.7. Smear review was unremarkable.    Interval history-patient is on a keto diet and has lost about 30 pounds so far.  No recurrent infections or hospitalizations.  ECOG PS- 0 Pain  scale- 0   Review of systems- Review of Systems  Constitutional: Negative for chills, fever, malaise/fatigue and weight loss.  HENT: Negative for congestion, ear discharge and nosebleeds.   Eyes: Negative for blurred vision.  Respiratory: Negative for cough, hemoptysis, sputum production, shortness of breath and wheezing.   Cardiovascular: Negative for chest pain, palpitations, orthopnea and claudication.  Gastrointestinal: Negative for abdominal pain, blood in stool, constipation, diarrhea, heartburn, melena, nausea and vomiting.  Genitourinary: Negative for dysuria, flank pain, frequency, hematuria and urgency.  Musculoskeletal: Negative for back pain, joint pain and myalgias.  Skin: Negative for rash.  Neurological: Negative for dizziness, tingling, focal weakness, seizures, weakness and headaches.  Endo/Heme/Allergies: Does not bruise/bleed easily.  Psychiatric/Behavioral: Negative for depression and suicidal ideas. The patient does not have insomnia.      Allergies  Allergen Reactions  . Codeine Anaphylaxis  . Aspirin Other (See Comments)    Nose bleeds  . Doxycycline Nausea And Vomiting  . Tramadol     Tingling in face     Past Medical History:  Diagnosis Date  . Anemia    H/O  . Anxiety   . Arthritis    HANDS  . Claustrophobia   . COVID 10/2019   monocloncal atb and got it 10/09/2019  . Dizziness   . Elevated blood pressure, situational    ASS WITH MIGRAINES-NO MEDS  . Family history of adverse reaction to anesthesia    MOM AND SISTER-HARD TIME WAKING UP(MOM) AND UNUSURE OF SISTERS REACTION  . GERD (gastroesophageal reflux disease)    OCC-TUMS PRN  . Macrocytosis  SEES HEMATOLOGIST DR Yenesis Even [facility]  . Migraines    MIGRAINES     Past Surgical History:  Procedure Laterality Date  . [place] WITH NOVASURE ABLATION N/A 07/08/2017   Procedure: DILATATION & CURETTAGE/HYSTEROSCOPY WITH NOVASURE  ABLATION;  Surgeon: Schermerhorn, Gwen Her, MD;  Location: [facility] ORS;  Service: Gynecology;  Laterality: N/A;  . HEMORRHOID SURGERY N/A 09/22/2018   Procedure: OPEN HEMORRHOIDECTOMY;  Surgeon: Benjamine Sprague, DO;  Location: [facility] ORS;  Service: General;  Laterality: N/A;  . LAPAROSCOPIC BILATERAL SALPINGECTOMY Bilateral 11/22/2017   Procedure: LAPAROSCOPIC BILATERAL SALPINGECTOMY;  Surgeon: Boykin Nearing, MD;  Location: [facility] ORS;  Service: Gynecology;  Laterality: Bilateral;  . LAPAROSCOPIC SUPRACERVICAL HYSTERECTOMY N/A 11/22/2017   Procedure: LAPAROSCOPIC SUPRACERVICAL HYSTERECTOMY;  Surgeon: Schermerhorn, Gwen Her, MD;  Location: [facility] ORS;  Service: Gynecology;  Laterality: N/A;  . TUBAL LIGATION  1993  . WISDOM TOOTH EXTRACTION      Social History   Socioeconomic History  . Marital status: Married    Spouse name: Not on file  . Number of children: Not on file  . Years of education: Not on file  . Highest education level: Not on file  Occupational History  . Not on file  Tobacco Use  . Smoking status: Current Every Day Smoker    Packs/day: 0.25    Years: 23.00    Pack years: 5.75    Types: Cigarettes  . Smokeless tobacco: Never Used  . Tobacco comment: now to 5 cig. a day and dec. by 1 cig every 3 days  Vaping Use  . Vaping Use: Never used  Substance and Sexual Activity  . Alcohol use: Yes    Comment: RARE WINE  . Drug use: No  . Sexual activity: Yes  Other Topics Concern  . Not on file  Social History Narrative  . Not on file   Social Determinants of Health   Financial Resource Strain: Not on file  Food Insecurity: Not on file  Transportation Needs: Not on file  Physical Activity: Not on file  Stress: Not on file  Social Connections: Not on file  Intimate Partner Violence: Not on file    Family History  Problem Relation Age of Onset  . Breast cancer Mother 54  . Stomach cancer Maternal Aunt      Current Outpatient Medications:  .  acetaminophen  (TYLENOL) 500 MG tablet, Take 500 mg by mouth every 6 (six) hours as needed., Disp: , Rfl:  .  azelastine (ASTELIN) 0.1 % nasal spray, Place 1 spray into both nostrils 2 (two) times daily. Use in each nostril as directed, Disp: , Rfl:  .  ibuprofen (ADVIL) 800 MG tablet, Take 1 tablet (800 mg total) by mouth every 8 (eight) hours as needed., Disp: 30 tablet, Rfl: 0 .  SUMAtriptan (IMITREX) 100 MG tablet, Take 100 mg by mouth every 2 (two) hours as needed for migraine. , Disp: , Rfl:  .  varenicline (CHANTIX) 1 MG tablet, Take 1 mg by mouth 2 (two) times daily., Disp: , Rfl:  .  vitamin B-12 (CYANOCOBALAMIN) 500 MCG tablet, Take 500 mcg by mouth daily. (Patient not taking: Reported on 02/04/2020), Disp: , Rfl:   Physical exam:  Vitals:   02/04/20 1127  BP: 122/79  Pulse: 65  Temp: (!) 97.3 F (36.3 C)  TempSrc: Tympanic  SpO2: 99%   Physical Exam Constitutional:      General: She is not  in acute distress. Eyes:     Extraocular Movements: EOM normal.  Cardiovascular:     Rate and Rhythm: Normal rate and regular rhythm.     Heart sounds: Normal heart sounds.  Pulmonary:     Effort: Pulmonary effort is normal.     Breath sounds: Normal breath sounds.  Abdominal:     General: Bowel sounds are normal.     Palpations: Abdomen is soft.  Skin:    General: Skin is warm and dry.  Neurological:     Mental Status: She is alert and oriented to person, place, and time.      CMP Latest Ref Rng & Units 11/29/2017  Glucose 70 - 99 mg/dL 110(H)  BUN 6 - 20 mg/dL 12  Creatinine 0.44 - 1.00 mg/dL 0.74  Sodium 135 - 145 mmol/L 138  Potassium 3.5 - 5.1 mmol/L 3.8  Chloride 98 - 111 mmol/L 103  CO2 22 - 32 mmol/L 26  Calcium 8.9 - 10.3 mg/dL 9.0  Total Protein 6.5 - 8.1 g/dL 7.7  Total Bilirubin 0.3 - 1.2 mg/dL 0.6  Alkaline Phos 38 - 126 U/L 52  AST 15 - 41 U/L 23  ALT 0 - 44 U/L 24   CBC Latest Ref Rng & Units 02/04/2020  WBC 4.0 - 10.5 K/uL 6.1  Hemoglobin 12.0 - 15.0 g/dL 14.7   Hematocrit 36.0 - 46.0 % 40.8  Platelets 150 - 400 K/uL 240     Assessment and plan- Patient is a 50 y.o. female with macrocytosis 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 here for routine follow-up  Patient had macrocytosis for the last 1 year.  Presently it has resolved.  Her CBC is normal and she has no anemia.  In the past she was found to have a mildly elevated serum free kappa light chain which has been checked today and is pending.  However her kappa lambda light chain ratio is normal and as such is does not require any further follow-up.  She can continue to follow-up with her primary care doctor and referred to [place] in the future if questions or concerns arise   Visit Diagnosis 1. Macrocytosis 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      Dr. Randa Evens, MD, MPH [facility] [phone] 02/04/2020 1:33 PM

## 2020-02-05 LAB — KAPPA/LAMBDA LIGHT CHAINS
Kappa free light chain: 18.5 mg/L (ref 3.3–19.4)
Kappa, lambda light chain ratio: 1.2 (ref 0.26–1.65)
Lambda free light chains: 15.4 mg/L (ref 5.7–26.3)

## 2020-02-12 IMAGING — CT CT RENAL STONE PROTOCOL
2 of 4 series · 15 of 46 positions shown, 17 images · non-contrast
Comparison: None.

CLINICAL DATA: Left lower quadrant abdominal pain since 4 o'clock
this morning. Evaluate for nephrolithiasis.

EXAM:
CT ABDOMEN AND PELVIS WITHOUT CONTRAST
TECHNIQUE: Multidetector CT imaging of the abdomen and pelvis was performed
following the standard protocol without IV contrast.

[Series 2: stone full standard · axial · 0.69mm/px · z∈[-465,-50]mm · 12 of 91 slices shown, 14 images]
[im 4/91  soft-tissue]
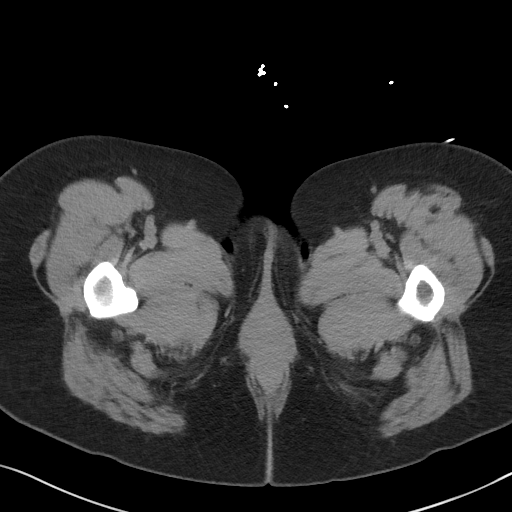
[im 4/91  bone]
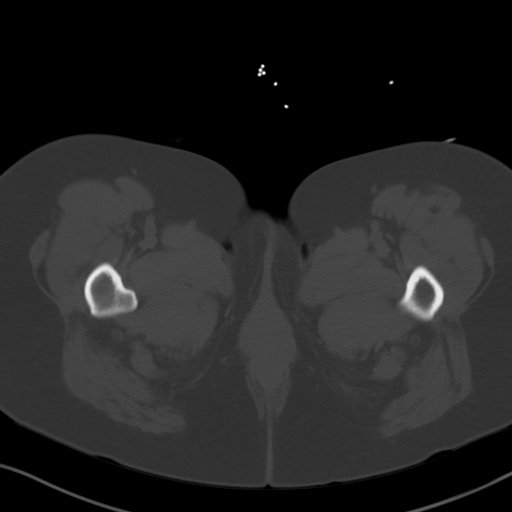
[im 12/91  soft-tissue]
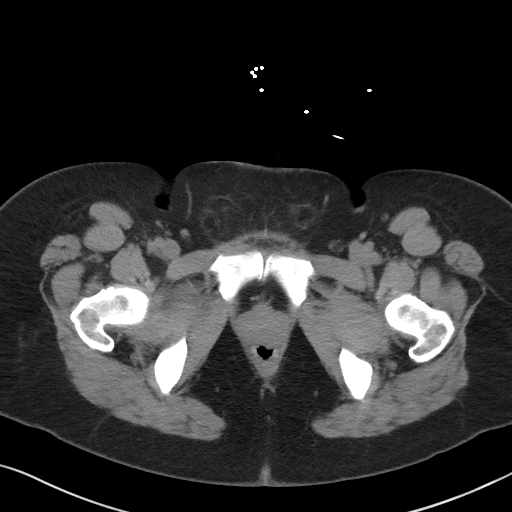
[im 19/91  soft-tissue]
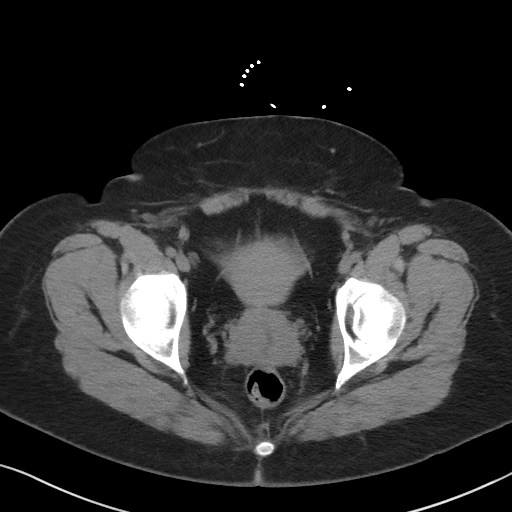
[im 27/91  soft-tissue]
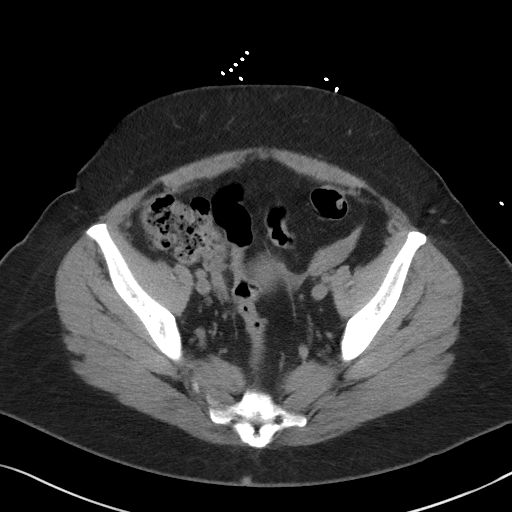
[im 34/91  soft-tissue]
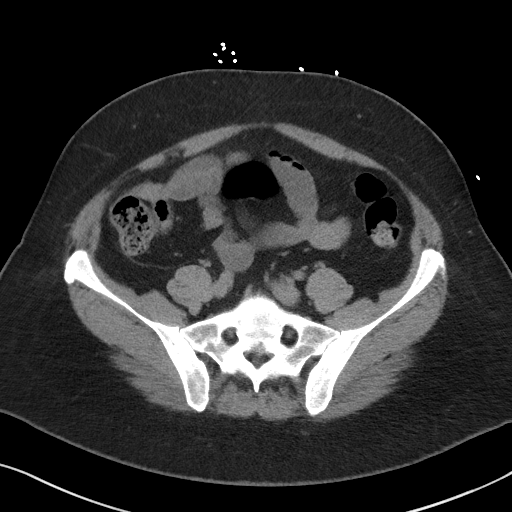
[im 42/91  soft-tissue]
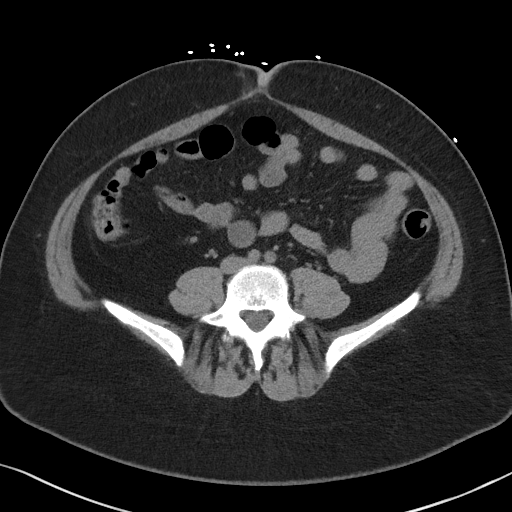
[im 49/91  soft-tissue]
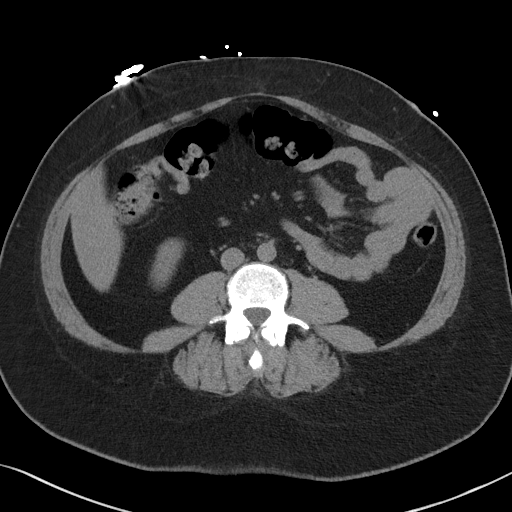
[im 57/91  soft-tissue]
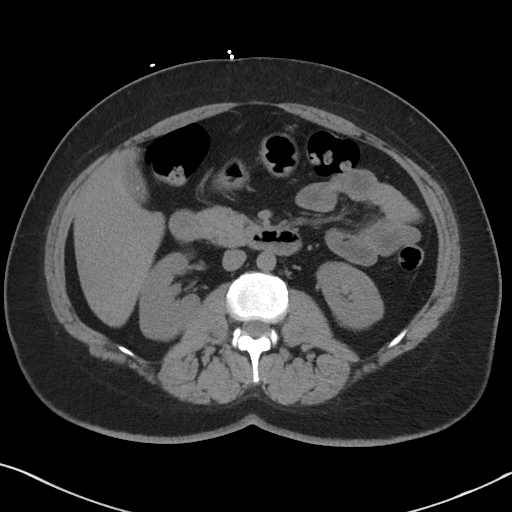
[im 64/91  soft-tissue]
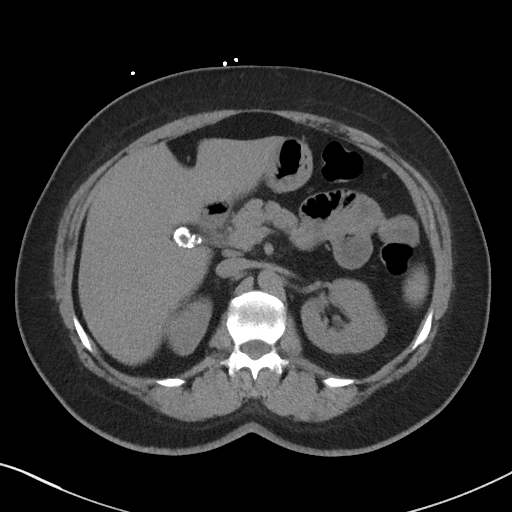
[im 64/91  bone]
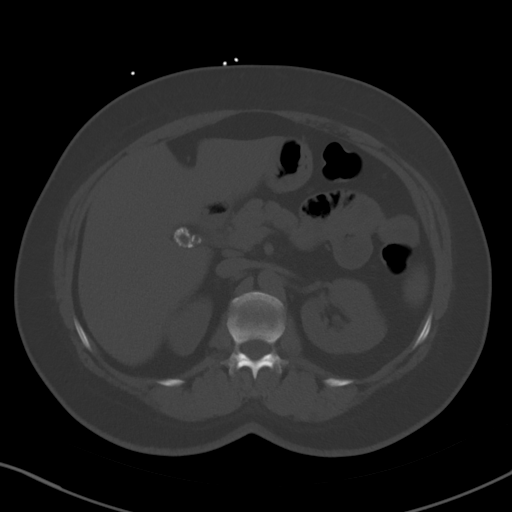
[im 72/91  soft-tissue]
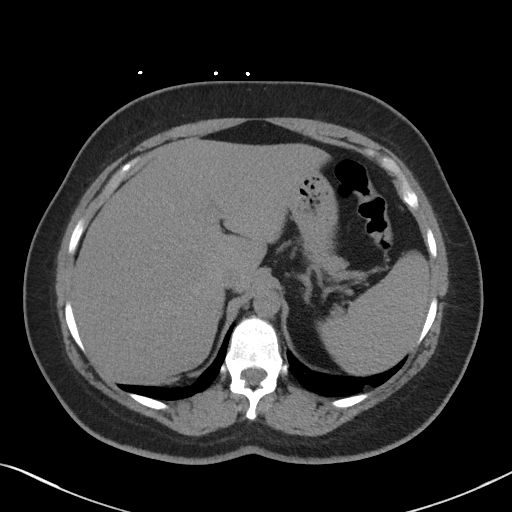
[im 79/91  soft-tissue]
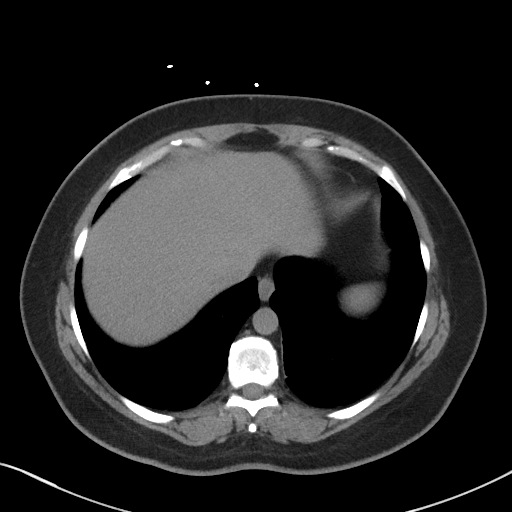
[im 87/91  soft-tissue]
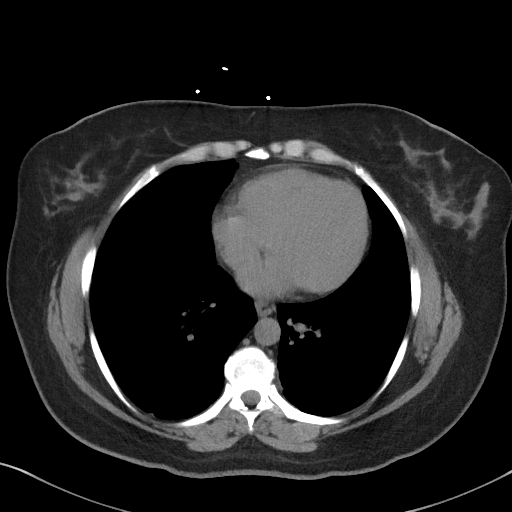

[Series 5: coronal · coronal · 0.83mm/px · 3 of 147 slices shown]
[im 49/147  soft-tissue]
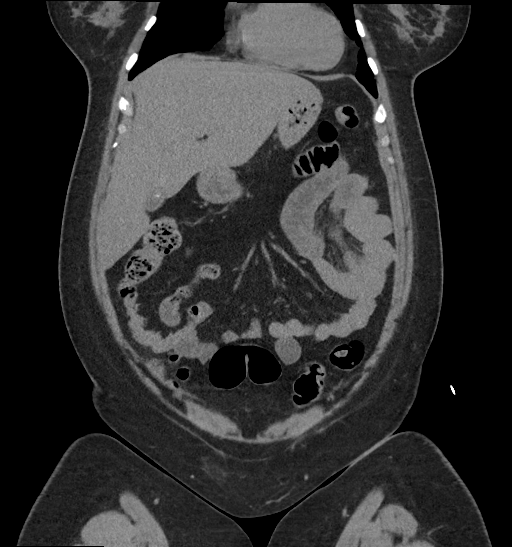
[im 65/147  soft-tissue]
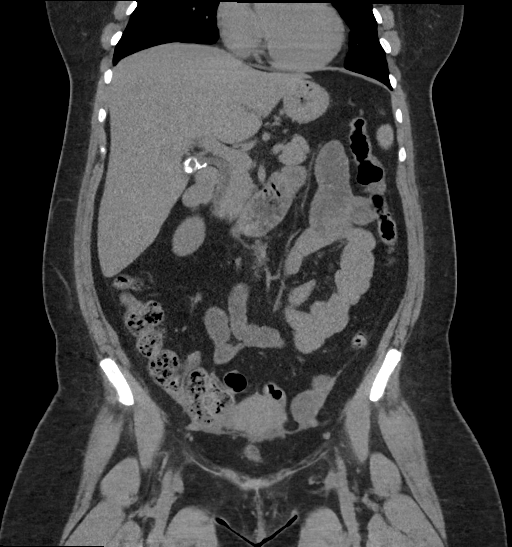
[im 82/147  soft-tissue]
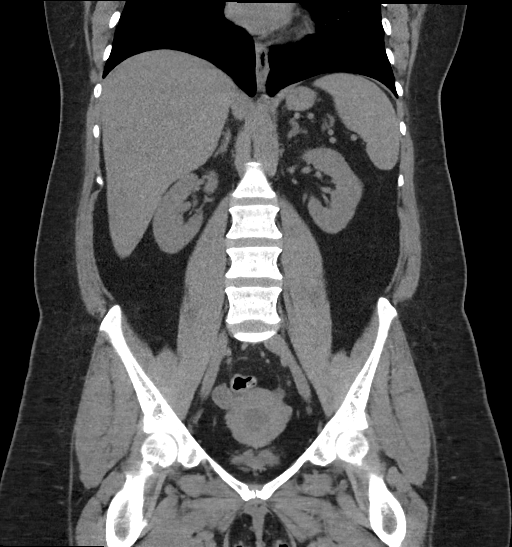

[15 of 46 positions shown; findings below may reference images not displayed]

FINDINGS: The lack of intravenous contrast limits the ability to evaluate
solid abdominal organs.

Lower chest: Limited visualization lower thorax demonstrates minimal
subsegmental atelectasis within the imaged bilateral lung bases.
Note is made of a approximately 0.6 cm subpleural nodule within the
left lower lobe (image 20, series 2). No pleural effusion.

Normal heart size.  No pericardial effusion.

Hepatobiliary: Normal hepatic contour. There are several
peripherally calcified stones otherwise normal-appearing
gallbladder. Dominant stone located within the neck of an measures
approximately 1.4 x 1.2 cm (image 28, series 2). No definitive
gallbladder wall thickening or pericholecystic fluid on this
noncontrast examination. No ascites.

Pancreas: Normal noncontrast appearance of the pancreas

Spleen: Normal noncontrast appearance the spleen

Adrenals/Urinary Tract: There is an approximately 0.8 cm
hypoattenuating lesion within the interpolar aspect the left kidney
(image 33, series 2), too small to adequately characterize though
likely representative of a renal cyst. Punctate (sub 1 mm) renal
stones are suspected bilaterally (representative coronal images 78,
81 and 93, series 5). No renal stones are seen along expected course
of either ureter or the urinary bladder. Punctate phleboliths seen
within the right hemipelvis. Urinary bladder is decompressed. No
urine obstruction or perinephric stranding.

Normal noncontrast appearance the bilateral adrenal glands.

Stomach/Bowel: Bowel is normal in course and caliber without wall
thickening or evidence of enteric obstruction. Normal noncontrast
appearance of the terminal ileum and retrocecal appendix. No
pneumoperitoneum, pneumatosis or portal venous gas.

Vascular/Lymphatic: Minimal amount of calcified atherosclerotic
plaque with a normal caliber abdominal aorta.

No bulky retroperitoneal, mesenteric, pelvic or inguinal
lymphadenopathy on this noncontrast examination.

Reproductive: Note is made of 2 hypoattenuating left-sided
presumably physiologic adnexal cysts with dominant presumed cyst
measuring approximately 2.4 x 1.7 cm (axial image 68, series 2)
additional presumed cyst measuring approximately 1.7 x 1.3 cm
(coronal image 68, series 5). No discrete right-sided adnexal
lesion. Small amount of fluid is seen within the endometrial canal.
No free fluid the pelvic cul-de-sac.

Other: Regional soft tissues are normal.

Musculoskeletal: No acute or aggressive osseous abnormalities mild
degenerative change of the pubic symphysis.
IMPRESSION: 1. Note is made of two left-sided presumably physiologic adnexal
cysts - Further evaluation could be performed pelvic ultrasound as
clinically indicated. Otherwise, no explanation patient's left lower
quadrant abdominal pain. Specifically, no evidence of enteric or
urinary obstruction. Normal noncontrast appearance of the appendix.
2. Cholelithiasis without evidence of cholecystitis on this
noncontrast examination.
3. Suspected punctate (sub 1 mm) bilateral nephrolithiasis.
4. Note is made of an approximately 0.6 cm left lower lobe pulmonary
nodule. Non-contrast chest CT at 6-12 months is recommended. If the
nodule is stable at time of repeat CT, then future CT at 18-24
months (from today's scan) is considered optional for low-risk
patients, but is recommended for high-risk patients. This
recommendation follows the consensus statement: Guidelines for
Management of Incidental Pulmonary Nodules Detected on CT Images:

## 2021-01-01 ENCOUNTER — Encounter: Payer: Self-pay | Admitting: Emergency Medicine

## 2021-01-01 ENCOUNTER — Ambulatory Visit
Admission: EM | Admit: 2021-01-01 | Discharge: 2021-01-01 | Disposition: A | Discharge status: Home / Self Care | Payer: Commercial Managed Care - PPO | Attending: Physician Assistant | Admitting: Physician Assistant

## 2021-01-01 ENCOUNTER — Other Ambulatory Visit: Payer: Self-pay

## 2021-01-01 DIAGNOSIS — L03011 Cellulitis of right finger: Secondary | ICD-10-CM

## 2021-01-01 MED ORDER — SULFAMETHOXAZOLE-TRIMETHOPRIM 800-160 MG PO TABS: 1.0000 | ORAL_TABLET | Freq: Two times a day (BID) | ORAL | 0 refills | Status: AC

## 2021-01-01 NOTE — ED Provider Notes (Signed)
[facility]    CSN: [id number] Arrival date & time: 01/01/21  1811      History   Chief Complaint Chief Complaint  Patient presents with   Hand Pain    Right left thumb    HPI Wendy Russell is a 50 y.o. female presenting for 3-day history of right thumb pain.  She says she pulled off a hangnail a couple days ago.  She says over the past 1 to 2 days she has had significant increase in swelling and pain.  Patient is a TEFL teacher.  Reports frequent taking off and putting out of gloves which is seem to irritate her hand as well.  Patient is also been using a lot of sanitizer.  She denies any drainage from the finger.  No fevers.  Patient says symptoms have significantly worsened throughout the day.  No other complaints.  HPI  Past Medical History:  Diagnosis Date   Anemia    H/O   Anxiety    Arthritis    HANDS   Claustrophobia    COVID 10/2019   monocloncal atb and got it 10/09/2019   Dizziness    Elevated blood pressure, situational    ASS WITH MIGRAINES-NO MEDS   Family history of adverse reaction to anesthesia    MOM AND SISTER-HARD TIME WAKING UP(MOM) AND UNUSURE OF SISTERS REACTION   GERD (gastroesophageal reflux disease)    OCC-TUMS PRN   Macrocytosis    SEES HEMATOLOGIST DR [facility]O IN [facility]   Migraines    MIGRAINES    Patient Active Problem List   Diagnosis Date Noted   Left lower quadrant pain 07/26/2018   Headache disorder 04/03/2018   Post-operative state 11/22/2017   Healthcare maintenance 06/17/2017   Migraine without aura and without status migrainosus, not intractable 12/08/2015   Anxiety, generalized 12/05/2015    Past Surgical History:  Procedure Laterality Date   CESAREAN SECTION     X2   DILITATION & CURRETTAGE/HYSTROSCOPY WITH NOVASURE ABLATION N/A 07/08/2017   Procedure: DILATATION & CURETTAGE/HYSTEROSCOPY WITH NOVASURE ABLATION;  Surgeon: Boykin Nearing, MD;  Location: [facility];  Service: Gynecology;   Laterality: N/A;   HEMORRHOID SURGERY N/A 09/22/2018   Procedure: OPEN HEMORRHOIDECTOMY;  Surgeon: Benjamine Sprague, DO;  Location: [facility];  Service: General;  Laterality: N/A;   LAPAROSCOPIC BILATERAL SALPINGECTOMY Bilateral 11/22/2017   Procedure: LAPAROSCOPIC BILATERAL SALPINGECTOMY;  Surgeon: Boykin Nearing, MD;  Location: [facility];  Service: Gynecology;  Laterality: Bilateral;   LAPAROSCOPIC SUPRACERVICAL HYSTERECTOMY N/A 11/22/2017   Procedure: LAPAROSCOPIC SUPRACERVICAL HYSTERECTOMY;  Surgeon: Schermerhorn, Gwen Her, MD;  Location: [facility];  Service: Gynecology;  Laterality: N/A;   TUBAL LIGATION  1993   WISDOM TOOTH EXTRACTION      OB History   No obstetric history on file.      Home Medications    Prior to Admission medications   Medication Sig Start Date End Date Taking? Authorizing Provider  sulfamethoxazole-trimethoprim (BACTRIM DS) 800-160 MG tablet Take 1 tablet by mouth 2 (two) times daily for 7 days. 01/01/21 01/08/21 Yes Danton Clap, PA-C  acetaminophen (TYLENOL) 500 MG tablet Take 500 mg by mouth every 6 (six) hours as needed.    [provider]  azelastine (ASTELIN) 0.1 % nasal spray Place 1 spray into both nostrils 2 (two) times daily. Use in each nostril as directed    [provider]  ibuprofen (ADVIL) 800 MG tablet Take 1 tablet (800 mg total) by mouth every  8 (eight) hours as needed. 09/22/18   Lysle Pearl, Isami, DO  SUMAtriptan (IMITREX) 100 MG tablet Take 100 mg by mouth every 2 (two) hours as needed for migraine.  06/18/18   [provider]  varenicline (CHANTIX) 1 MG tablet Take 1 mg by mouth 2 (two) times daily.    [provider]  vitamin B-12 (CYANOCOBALAMIN) 500 MCG tablet Take 500 mcg by mouth daily. Patient not taking: Reported on 02/04/2020    [provider]    Family History Family History  Problem Relation Age of Onset   Breast cancer Mother 25   Stomach cancer Maternal Aunt     Social  History Social History   Tobacco Use   Smoking status: Every Day    Packs/day: 0.50    Years: 23.00    Pack years: 11.50    Types: Cigarettes   Smokeless tobacco: Never   Tobacco comments:    now to 5 cig. a day and dec. by 1 cig every 3 days  Vaping Use   Vaping Use: Never used  Substance Use Topics   Alcohol use: Yes    Comment: RARE WINE   Drug use: No     Allergies   Codeine, Aspirin, Doxycycline, and Tramadol   Review of Systems Review of Systems  Constitutional:  Negative for fever.  Musculoskeletal:  Positive for joint swelling.  Skin:  Positive for color change. Negative for wound.  Neurological:  Negative for weakness and numbness.    Physical Exam Triage Vital Signs ED Triage Vitals  Enc Vitals Group     BP 01/01/21 1838 119/74     Pulse Rate 01/01/21 1838 82     Resp 01/01/21 1838 18     Temp 01/01/21 1838 98.1 F (36.7 C)     Temp Source 01/01/21 1838 Oral     SpO2 01/01/21 1838 100 %     Weight 01/01/21 1835 160 lb 15 oz (73 kg)     Height 01/01/21 1835 4\' 11"  (1.499 m)     Head Circumference --      Peak Flow --      Pain Score 01/01/21 1835 5     Pain Loc --      Pain Edu? --      Excl. in [place]? --    No data found.  Updated Vital Signs BP 119/74 (BP Location: Left Arm)   Pulse 82   Temp 98.1 F (36.7 C) (Oral)   Resp 18   Ht 4\' 11"  (1.499 m)   Wt 160 lb 15 oz (73 kg)   LMP 06/20/2017 Comment: upreg neg  SpO2 100%   BMI 32.51 kg/m       Physical Exam Vitals and nursing note reviewed.  Constitutional:      General: She is not in acute distress.    Appearance: Normal appearance. She is not ill-appearing or toxic-appearing.  HENT:     Head: Normocephalic and atraumatic.  Eyes:     General: No scleral icterus.       Right eye: No discharge.        Left eye: No discharge.     Conjunctiva/sclera: Conjunctivae normal.  Cardiovascular:     Rate and Rhythm: Normal rate and regular rhythm.     Pulses: Normal pulses.  Pulmonary:      Effort: Pulmonary effort is normal. No respiratory distress.  Musculoskeletal:     Cervical back: Neck supple.  Skin:    Comments: RIGHT THUMB:  Erythema and swelling of the cuticle.  Yellowish fluid under her skin.  Area is very tender to palpation.  No drainage.  Full range of motion of thumb but does have pain when she flexes it.  Good pulses and sensation.  Neurological:     General: No focal deficit present.     Mental Status: She is alert. Mental status is at baseline.     Motor: No weakness.     Coordination: Coordination normal.     Gait: Gait normal.  Psychiatric:        Mood and Affect: Mood normal.        Behavior: Behavior normal.        Thought Content: Thought content normal.     UC Treatments / Results  Labs (all labs ordered are listed, but only abnormal results are displayed) Labs Reviewed - No data to display  EKG   Radiology No results found.  Procedures Procedures (including critical care time)  I&D of paronychia: Patient gave consent for procedure.  Area cleaned with alcohol swabs then used a 21-gauge needle to drain large amount of foul-smelling yellowish thick pustular material.  Patient tolerated well.  Area cleaned again with alcohol swab and covered with a Band-Aid.  Medications Ordered in UC Medications - No data to display  Initial Impression / Assessment and Plan / UC Course  I have reviewed the triage vital signs and the nursing notes.  Pertinent labs & imaging results that were available during my care of the patient were reviewed by me and considered in my medical decision making (see chart for details).  50 year old female presenting for paronychia of right thumb.  Patient agrees to I&D.  I&D procedure successful, large amount of foul-smelling yellowish thick drainage.  Patient to be treated with Bactrim DS since she had some erythema and swelling further out from the cuticle.  Reviewed use of warm soaks, Tylenol Motrin for discomfort.   Thoroughly reviewed return and ER precautions especially if you develop a fever, increased pain, swelling, red streak up hand.   Final Clinical Impressions(s) / UC Diagnoses   Final diagnoses:  Paronychia of right thumb     Discharge Instructions      -Warm water soaks a couple times throughout the day. - I was able to drain out the pus out but you still need to take the oral antibiotic over the next week. - You should be seen again if you develop any increased redness or swelling of the thumb, pain or fever. -Tylenol or Motrin for discomfort. - You should be feeling a lot better in the next 2 days.   ED Prescriptions     Medication Sig Dispense Auth. Provider   sulfamethoxazole-trimethoprim (BACTRIM DS) 800-160 MG tablet Take 1 tablet by mouth 2 (two) times daily for 7 days. 14 tablet Gretta Cool      PDMP not reviewed this encounter.   Danton Clap, PA-C 01/01/21 2025

## 2021-01-01 NOTE — ED Triage Notes (Addendum)
Pt c/o right thumb pain. Started about 3 days ago. The thumb is red, swollen and painful. She states she had a hang nail that she chipped off a day prior to it getting painful.

## 2021-01-01 NOTE — Discharge Instructions (Signed)
-  Warm water soaks a couple times throughout the day. - I was able to drain out the pus out but you still need to take the oral antibiotic over the next week. - You should be seen again if you develop any increased redness or swelling of the thumb, pain or fever. -Tylenol or Motrin for discomfort. - You should be feeling a lot better in the next 2 days.

## 2021-02-13 ENCOUNTER — Other Ambulatory Visit: Payer: Self-pay | Admitting: Internal Medicine

## 2021-02-13 DIAGNOSIS — Z1231 Encounter for screening mammogram for malignant neoplasm of breast: Secondary | ICD-10-CM

## 2021-03-27 ENCOUNTER — Ambulatory Visit
Admission: RE | Admit: 2021-03-27 | Discharge: 2021-03-27 | Disposition: A | Discharge status: Home / Self Care | Payer: Commercial Managed Care - PPO | Source: Ambulatory Visit | Attending: Internal Medicine | Admitting: Internal Medicine

## 2021-03-27 ENCOUNTER — Other Ambulatory Visit: Payer: Self-pay

## 2021-03-27 DIAGNOSIS — Z1231 Encounter for screening mammogram for malignant neoplasm of breast: Secondary | ICD-10-CM | POA: Insufficient documentation

## 2022-01-12 ENCOUNTER — Other Ambulatory Visit: Payer: Self-pay | Admitting: Physical Medicine & Rehabilitation

## 2022-01-12 ENCOUNTER — Ambulatory Visit
Admission: RE | Admit: 2022-01-12 | Discharge: 2022-01-12 | Disposition: A | Discharge status: Home / Self Care | Payer: Commercial Managed Care - PPO | Source: Ambulatory Visit | Attending: Physical Medicine & Rehabilitation | Admitting: Physical Medicine & Rehabilitation

## 2022-01-12 DIAGNOSIS — M5442 Lumbago with sciatica, left side: Secondary | ICD-10-CM

## 2022-12-06 ENCOUNTER — Ambulatory Visit: Payer: Commercial Managed Care - PPO

## 2022-12-06 DIAGNOSIS — Z860101 Personal history of adenomatous and serrated colon polyps: Secondary | ICD-10-CM | POA: Diagnosis not present

## 2022-12-06 DIAGNOSIS — K640 First degree hemorrhoids: Secondary | ICD-10-CM | POA: Diagnosis not present

## 2022-12-06 DIAGNOSIS — Z1211 Encounter for screening for malignant neoplasm of colon: Secondary | ICD-10-CM | POA: Diagnosis present

## 2022-12-06 DIAGNOSIS — D122 Benign neoplasm of ascending colon: Secondary | ICD-10-CM | POA: Diagnosis not present

## 2023-02-18 ENCOUNTER — Other Ambulatory Visit: Payer: Self-pay | Admitting: Internal Medicine

## 2023-02-18 DIAGNOSIS — Z1231 Encounter for screening mammogram for malignant neoplasm of breast: Secondary | ICD-10-CM

## 2023-03-02 ENCOUNTER — Ambulatory Visit
Admission: RE | Admit: 2023-03-02 | Discharge: 2023-03-02 | Disposition: A | Discharge status: Home / Self Care | Payer: Commercial Managed Care - PPO | Source: Ambulatory Visit | Attending: Internal Medicine | Admitting: Internal Medicine

## 2023-03-02 DIAGNOSIS — Z1231 Encounter for screening mammogram for malignant neoplasm of breast: Secondary | ICD-10-CM | POA: Insufficient documentation

## 2023-03-07 ENCOUNTER — Other Ambulatory Visit: Payer: Self-pay | Admitting: Internal Medicine

## 2023-03-07 DIAGNOSIS — R928 Other abnormal and inconclusive findings on diagnostic imaging of breast: Secondary | ICD-10-CM

## 2023-03-08 ENCOUNTER — Other Ambulatory Visit: Payer: Self-pay | Admitting: Medical Genetics

## 2023-03-09 ENCOUNTER — Other Ambulatory Visit
Admission: RE | Admit: 2023-03-09 | Discharge: 2023-03-09 | Disposition: A | Discharge status: Home / Self Care | Payer: Commercial Managed Care - PPO | Source: Ambulatory Visit | Attending: Medical Genetics | Admitting: Medical Genetics

## 2023-03-09 ENCOUNTER — Ambulatory Visit
Admission: RE | Admit: 2023-03-09 | Discharge: 2023-03-09 | Disposition: A | Discharge status: Home / Self Care | Payer: Commercial Managed Care - PPO | Source: Ambulatory Visit | Attending: Internal Medicine | Admitting: Internal Medicine

## 2023-03-09 DIAGNOSIS — R928 Other abnormal and inconclusive findings on diagnostic imaging of breast: Secondary | ICD-10-CM | POA: Diagnosis present

## 2023-03-10 ENCOUNTER — Other Ambulatory Visit: Payer: Self-pay | Admitting: Internal Medicine

## 2023-03-10 DIAGNOSIS — R928 Other abnormal and inconclusive findings on diagnostic imaging of breast: Secondary | ICD-10-CM

## 2023-03-10 DIAGNOSIS — R921 Mammographic calcification found on diagnostic imaging of breast: Secondary | ICD-10-CM

## 2023-03-11 ENCOUNTER — Encounter (INDEPENDENT_AMBULATORY_CARE_PROVIDER_SITE_OTHER): Payer: Self-pay

## 2023-03-14 ENCOUNTER — Ambulatory Visit
Admission: RE | Admit: 2023-03-14 | Discharge: 2023-03-14 | Disposition: A | Discharge status: Home / Self Care | Payer: Commercial Managed Care - PPO | Source: Ambulatory Visit | Attending: Internal Medicine | Admitting: Internal Medicine

## 2023-03-14 DIAGNOSIS — R921 Mammographic calcification found on diagnostic imaging of breast: Secondary | ICD-10-CM | POA: Insufficient documentation

## 2023-03-14 DIAGNOSIS — R928 Other abnormal and inconclusive findings on diagnostic imaging of breast: Secondary | ICD-10-CM | POA: Insufficient documentation

## 2023-03-14 DIAGNOSIS — D0511 Intraductal carcinoma in situ of right breast: Secondary | ICD-10-CM | POA: Diagnosis not present

## 2023-03-14 HISTORY — PX: BREAST BIOPSY: SHX20

## 2023-03-14 MED ORDER — LIDOCAINE 1 % OPTIME INJ - NO CHARGE
5.0000 mL | Freq: Once | INTRAMUSCULAR | Status: AC
  Administered 2023-03-14: 5 mL
  Filled 2023-03-14: qty 6

## 2023-03-14 MED ORDER — LIDOCAINE-EPINEPHRINE 1 %-1:100000 IJ SOLN
20.0000 mL | Freq: Once | INTRAMUSCULAR | Status: AC
  Administered 2023-03-14: 20 mL

## 2023-03-15 LAB — SURGICAL PATHOLOGY

## 2023-03-16 ENCOUNTER — Encounter: Payer: Self-pay | Admitting: *Deleted

## 2023-03-16 DIAGNOSIS — D0511 Intraductal carcinoma in situ of right breast: Secondary | ICD-10-CM

## 2023-03-16 NOTE — Progress Notes (Signed)
Received referral for newly diagnosed breast cancer from [facility].  Navigation initiated.  She will see Dr. Tonna Boehringer on Monday 2/17 and Dr. Smith Robert on 2/18.

## 2023-03-22 ENCOUNTER — Encounter: Payer: Self-pay | Admitting: Oncology

## 2023-03-22 ENCOUNTER — Inpatient Hospital Stay: Payer: Commercial Managed Care - PPO

## 2023-03-22 ENCOUNTER — Encounter: Payer: Self-pay | Admitting: *Deleted

## 2023-03-22 ENCOUNTER — Inpatient Hospital Stay: Payer: Commercial Managed Care - PPO | Attending: Oncology | Admitting: Oncology

## 2023-03-22 VITALS — BP 125/69 | HR 75 | Temp 96.5°F | Resp 18 | Ht 59.0 in | Wt 165.2 lb

## 2023-03-22 DIAGNOSIS — Z1722 Progesterone receptor negative status: Secondary | ICD-10-CM | POA: Insufficient documentation

## 2023-03-22 DIAGNOSIS — Z8041 Family history of malignant neoplasm of ovary: Secondary | ICD-10-CM | POA: Diagnosis not present

## 2023-03-22 DIAGNOSIS — D0511 Intraductal carcinoma in situ of right breast: Secondary | ICD-10-CM

## 2023-03-22 DIAGNOSIS — Z7189 Other specified counseling: Secondary | ICD-10-CM

## 2023-03-22 DIAGNOSIS — Z803 Family history of malignant neoplasm of breast: Secondary | ICD-10-CM | POA: Insufficient documentation

## 2023-03-22 DIAGNOSIS — Z171 Estrogen receptor negative status [ER-]: Secondary | ICD-10-CM | POA: Diagnosis not present

## 2023-03-22 DIAGNOSIS — F17210 Nicotine dependence, cigarettes, uncomplicated: Secondary | ICD-10-CM | POA: Diagnosis not present

## 2023-03-22 LAB — GENECONNECT MOLECULAR SCREEN: Genetic Analysis Overall Interpretation: NEGATIVE

## 2023-03-22 NOTE — Progress Notes (Unsigned)
 Accompanied patient and family to initial medical oncology appointment.   Reviewed Breast Cancer treatment handbook.   Care plan summary given to patient.   Reviewed outreach programs and cancer center services.

## 2023-03-23 ENCOUNTER — Other Ambulatory Visit: Payer: Self-pay | Admitting: Surgery

## 2023-03-23 ENCOUNTER — Encounter: Payer: Self-pay | Admitting: *Deleted

## 2023-03-23 DIAGNOSIS — D0511 Intraductal carcinoma in situ of right breast: Secondary | ICD-10-CM | POA: Insufficient documentation

## 2023-03-23 NOTE — Progress Notes (Signed)
 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 Dareen Piano  Date of visit: 03/23/23   History of presenting illness- Patient is a 53 year old female who was seen by me in the past back in 2022 for macrocytosis 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  She is now being referred for new diagnosis of DCIS of the right breast.Patient had a screening mammogram in January 2025 which was followed by diagnostic mammogram which showed 10 mm group of calcifications in the right breast.  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  Bilateral breast MRI was recommended given family history and high-grade DCIS.  Patient has been seen by Dr. Tonna Boehringer and plan is for lumpectomy on 04/15/2023.  No personal history of breast cancer or breast biopsies.  Family history significant for breast cancer in her mother and maternal cousin.  Mother also had ovarian cancer.  History of stomach cancer in maternal aunt.  She is G2 P2.  Age at first birth 31.  Hysterectomy in 2019.  No use of any OCPs or hormone replacement therapy.  ECOG PS- 0  Pain scale- 0   Review of systems- Review of Systems  Constitutional:  Negative for chills, fever, malaise/fatigue and weight loss.  HENT:  Negative for congestion, ear discharge and nosebleeds.   Eyes:  Negative for blurred vision.  Respiratory:  Negative for cough, hemoptysis, sputum production, shortness of breath and wheezing.   Cardiovascular:  Negative for chest pain, palpitations, orthopnea and claudication.   Gastrointestinal:  Negative for abdominal pain, blood in stool, constipation, diarrhea, heartburn, melena, nausea and vomiting.  Genitourinary:  Negative for dysuria, flank pain, frequency, hematuria and urgency.  Musculoskeletal:  Negative for back pain, joint pain and myalgias.  Skin:  Negative for rash.  Neurological:  Negative for dizziness, tingling, focal weakness, seizures, weakness and headaches.  Endo/Heme/Allergies:  Does not bruise/bleed easily.  Psychiatric/Behavioral:  Negative for depression and suicidal ideas. The patient does not have insomnia.     Allergies  Allergen Reactions   Codeine Anaphylaxis   Aspirin Other (See Comments)    Nose bleeds   Cat Dander Itching   Doxycycline Nausea And Vomiting   Tramadol     Tingling in face   Dog Epithelium (Canis Lupus Familiaris) Itching    Patient Active Problem List   Diagnosis Date Noted   Left lower quadrant pain 07/26/2018   Headache disorder 04/03/2018   Post-operative state 11/22/2017   Healthcare maintenance 06/17/2017   Migraine without aura and without status migrainosus, not intractable 12/08/2015   Anxiety, generalized 12/05/2015     Past Medical History:  Diagnosis Date   Anemia    H/O   Anxiety    Arthritis    HANDS   Claustrophobia    COVID 10/2019   monocloncal atb and got it 10/09/2019   Dizziness    Elevated blood pressure, situational    ASS WITH MIGRAINES-NO MEDS   Family history of adverse reaction to anesthesia    MOM AND SISTER-HARD TIME WAKING UP(MOM) AND UNUSURE OF SISTERS REACTION   GERD (gastroesophageal reflux disease)  OCC-TUMS PRN   Macrocytosis    SEES HEMATOLOGIST DR Fiona Coto [facility]   Migraines    MIGRAINES     Past Surgical History:  Procedure Laterality Date   BREAST BIOPSY Right 03/14/2023   Stereo bx, x-clip, path pending   BREAST BIOPSY Right 03/14/2023   MM RT BREAST BX W LOC DEV 1ST LESION IMAGE BX SPEC STEREO GUIDE 03/14/2023 [facility]   CESAREAN  SECTION     X2   DILITATION & CURRETTAGE/HYSTROSCOPY WITH NOVASURE ABLATION N/A 07/08/2017   Procedure: DILATATION & CURETTAGE/HYSTEROSCOPY WITH NOVASURE ABLATION;  Surgeon: Suzy Bouchard, MD;  Location: [facility];  Service: Gynecology;  Laterality: N/A;   HEMORRHOID SURGERY N/A 09/22/2018   Procedure: OPEN HEMORRHOIDECTOMY;  Surgeon: Sung Amabile, DO;  Location: [facility];  Service: General;  Laterality: N/A;   LAPAROSCOPIC BILATERAL SALPINGECTOMY Bilateral 11/22/2017   Procedure: LAPAROSCOPIC BILATERAL SALPINGECTOMY;  Surgeon: Suzy Bouchard, MD;  Location: [facility];  Service: Gynecology;  Laterality: Bilateral;   LAPAROSCOPIC SUPRACERVICAL HYSTERECTOMY N/A 11/22/2017   Procedure: LAPAROSCOPIC SUPRACERVICAL HYSTERECTOMY;  Surgeon: Schermerhorn, Ihor Austin, MD;  Location: [facility];  Service: Gynecology;  Laterality: N/A;   TUBAL LIGATION  1993   WISDOM TOOTH EXTRACTION      Social History   Socioeconomic History   Marital status: Married    Spouse name: Not on file   Number of children: Not on file   Years of education: Not on file   Highest education level: Not on file  Occupational History   Not on file  Tobacco Use   Smoking status: Every Day    Current packs/day: 0.50    Average packs/day: 0.5 packs/day for 23.0 years (11.5 ttl pk-yrs)    Types: Cigarettes   Smokeless tobacco: Never   Tobacco comments:    now to 5 cig. a day and dec. by 1 cig every 3 days  Vaping Use   Vaping status: Never Used  Substance and Sexual Activity   Alcohol use: Yes    Comment: RARE WINE   Drug use: No   Sexual activity: Yes  Other Topics Concern   Not on file  Social History Narrative   Not on file   Social Drivers of Health   Financial Resource Strain: Not on file  Food Insecurity: No Food Insecurity (03/22/2023)   Hunger Vital Sign    Worried About Running Out of Food in the Last Year: Never true    Ran Out of Food in the Last Year: Never true  Transportation Needs: No  Transportation Needs (03/22/2023)   PRAPARE - Administrator, Civil Service (Medical): No    Lack of Transportation (Non-Medical): No  Physical Activity: Not on file  Stress: Not on file  Social Connections: Not on file  Intimate Partner Violence: Not At Risk (03/22/2023)   Humiliation, Afraid, Rape, and Kick questionnaire    Fear of Current or Ex-Partner: No    Emotionally Abused: No    Physically Abused: No    Sexually Abused: No     Family History  Problem Relation Age of Onset   Breast cancer Mother 38   Stomach cancer Maternal Aunt      Current Outpatient Medications:    acetaminophen (TYLENOL) 500 MG tablet, Take 500 mg by mouth every 6 (six) hours as needed., Disp: , Rfl:    azelastine (ASTELIN) 0.1 % nasal spray, Place 1 spray into both nostrils 2 (two) times daily. Use in each nostril as directed,  Disp: , Rfl:    escitalopram (LEXAPRO) 10 MG tablet, Take 10 mg by mouth daily., Disp: , Rfl:    gabapentin (NEURONTIN) 300 MG capsule, 1 po qHS x 4 days, then bid x 4 days, then tid, Disp: , Rfl:    ibuprofen (ADVIL) 800 MG tablet, Take 1 tablet (800 mg total) by mouth every 8 (eight) hours as needed., Disp: 30 tablet, Rfl: 0   lidocaine-prilocaine (EMLA) cream, SMARTSIG:1 Topical Daily, Disp: , Rfl:    cefdinir (OMNICEF) 300 MG capsule, Take 300 mg by mouth 2 (two) times daily. (Patient not taking: Reported on 03/22/2023), Disp: , Rfl:    cetirizine (ZYRTEC) 10 MG tablet, Take by mouth. (Patient not taking: Reported on 03/22/2023), Disp: , Rfl:    predniSONE (DELTASONE) 20 MG tablet, Take 20 mg by mouth daily. (Patient not taking: Reported on 03/22/2023), Disp: , Rfl:    SUMAtriptan (IMITREX) 100 MG tablet, Take 100 mg by mouth every 2 (two) hours as needed for migraine. , Disp: , Rfl:    varenicline (CHANTIX) 1 MG tablet, Take 1 mg by mouth 2 (two) times daily., Disp: , Rfl:    vitamin B-12 (CYANOCOBALAMIN) 500 MCG tablet, Take 500 mcg by mouth daily. (Patient not taking:  Reported on 02/04/2020), Disp: , Rfl:    Physical exam:  Vitals:   03/22/23 1346  BP: 125/69  Pulse: 75  Resp: 18  Temp: (!) 96.5 F (35.8 C)  TempSrc: Tympanic  SpO2: 98%  Weight: 165 lb 3.2 oz (74.9 kg)  Height: 4\' 11"  (1.499 m)   Physical Exam Cardiovascular:     Rate and Rhythm: Normal rate and regular rhythm.     Heart sounds: Normal heart sounds.  Pulmonary:     Effort: Pulmonary effort is normal.     Breath sounds: Normal breath sounds.  Abdominal:     General: Bowel sounds are normal.     Palpations: Abdomen is soft.  Skin:    General: Skin is warm and dry.  Neurological:     Mental Status: She is alert and oriented to person, place, and time.   Breast exam: No palpable masses in bilateral breast.  No palpable bilateral axillary adenopathy.  Mild induration at the site of recent right breast biopsy.       Latest Ref Rng & Units 11/29/2017    4:09 PM  CMP  Glucose 70 - 99 mg/dL 161   BUN 6 - 20 mg/dL 12   Creatinine 0.96 - 1.00 mg/dL 0.45   Sodium 409 - 811 mmol/L 138   Potassium 3.5 - 5.1 mmol/L 3.8   Chloride 98 - 111 mmol/L 103   CO2 22 - 32 mmol/L 26   Calcium 8.9 - 10.3 mg/dL 9.0   Total Protein 6.5 - 8.1 g/dL 7.7   Total Bilirubin 0.3 - 1.2 mg/dL 0.6   Alkaline Phos 38 - 126 U/L 52   AST 15 - 41 U/L 23   ALT 0 - 44 U/L 24       Latest Ref Rng & Units 02/04/2020   11:14 AM  CBC  WBC 4.0 - 10.5 K/uL 6.1   Hemoglobin 12.0 - 15.0 g/dL 91.4   Hematocrit 78.2 - 46.0 % 40.8   Platelets 150 - 400 K/uL 240     No images are attached to the encounter.  MM RT BREAST BX W LOC DEV 1ST LESION IMAGE BX SPEC STEREO GUIDE Addendum Date: 03/16/2023 ADDENDUM REPORT: 03/16/2023 11:39 ADDENDUM: PATHOLOGY revealed: 1.  Breast, right, needle core biopsy, Upper outer breast middle depth (x clip) : DUCTAL CARCINOMA IN SITU, HIGH GRADE, SOLID AND CRIBRIFORM TYPES. NECROSIS: PRESENT. CALCIFICATIONS: PRESENT. DCIS LENGTH: 0.3 CM. Pathology results are CONCORDANT with  imaging findings, per Dr. Meda Klinefelter. Pathology results and recommendations were discussed with patient via telephone on 03/16/2023 by Randa Lynn RN. Patient reported biopsy site doing well with no adverse symptoms, and only slight tenderness at the site. Post biopsy care instructions were reviewed, questions were answered and my direct phone number was provided. Patient was instructed to call [facility] for any additional questions or concerns related to biopsy site. RECOMMENDATIONS: 1. Surgical and oncological consultation. Request for surgical and oncological consultation relayed to Irving Shows RN at [facility] by Randa Lynn RN on 03/16/2023. 2. Recommend pretreatment bilateral breast MRI with and without contrast to determine extent of breast disease given diagnosis of high grade DCIS and strong family history of breast cancer. Pathology results reported by Randa Lynn RN on 03/16/2023. Electronically Signed   By: Meda Klinefelter M.D.   On: 03/16/2023 11:39   Result Date: 03/16/2023 CLINICAL DATA:  Indeterminate calcifications. Strong family history of breast cancer EXAM: RIGHT BREAST STEREOTACTIC CORE NEEDLE BIOPSY COMPARISON:  Previous exam(s). FINDINGS: The patient and I discussed the procedure of stereotactic-guided biopsy including benefits and alternatives. We discussed the high likelihood of a successful procedure. We discussed the risks of the procedure including infection, bleeding, tissue injury, clip migration, and inadequate sampling. Informed written consent was given. The usual time out protocol was performed immediately prior to the procedure. Using sterile technique and 1% lidocaine and 1% lidocaine with epinephrine as local anesthetic, under stereotactic guidance, a 9 gauge vacuum assisted device was used to perform core needle biopsy of calcifications in the upper outer quadrant of the RIGHT breast using a lateral approach. Specimen radiograph was  performed showing representative calcifications. Specimens with calcifications are identified for pathology. Lesion quadrant: Upper outer quadrant At the conclusion of the procedure, an X shaped tissue marker clip was deployed into the biopsy cavity. Follow-up 2-view mammogram was performed and dictated separately. IMPRESSION: Stereotactic-guided biopsy of indeterminate calcifications. No apparent complications. Electronically Signed: By: Meda Klinefelter M.D. On: 03/14/2023 13:24   MM CLIP PLACEMENT RIGHT Result Date: 03/14/2023 CLINICAL DATA:  Status post stereotactic guided biopsy EXAM: 3D DIAGNOSTIC RIGHT MAMMOGRAM POST STEREOTACTIC BIOPSY COMPARISON:  Previous exam(s). FINDINGS: 3D Mammographic images were obtained following stereotactic guided biopsy of indeterminate calcifications. The X shaped biopsy marking clip is in expected position at the site of biopsy. Faint residual calcifications are located lateral to the biopsy marking clip. IMPRESSION: Appropriate positioning of the X shaped biopsy marking clip at the site of biopsy in the upper outer breast at middle depth. Final Assessment: Post Procedure Mammograms for Marker Placement Electronically Signed   By: Meda Klinefelter M.D.   On: 03/14/2023 13:23   MM 3D DIAGNOSTIC MAMMOGRAM UNILATERAL RIGHT BREAST Result Date: 03/09/2023 CLINICAL DATA:  RIGHT breast calcification callback. History of mother diagnosed with breast cancer at 5 EXAM: DIGITAL DIAGNOSTIC UNILATERAL RIGHT MAMMOGRAM WITH TOMOSYNTHESIS AND CAD TECHNIQUE: Right digital diagnostic mammography and breast tomosynthesis was performed. The images were evaluated with computer-aided detection. COMPARISON:  Previous exam(s). ACR Breast Density Category b: There are scattered areas of fibroglandular density. FINDINGS: Spot magnification views of the RIGHT breast demonstrate a 10 mm group of pleomorphic calcifications in the RIGHT upper breast at middle depth. These are not stable in  comparison  to remote prior mammograms. No additional suspicious findings are noted. IMPRESSION: 1. There is an indeterminate 10 mm group of calcifications in the RIGHT breast. Recommend stereotactic guided biopsy for definitive characterization. RECOMMENDATION: RIGHT breast stereotactic guided biopsy x1 I have discussed the findings and recommendations with the patient. The biopsy procedure was discussed with the patient and questions were answered. Patient expressed their understanding of the biopsy recommendation. Patient will be scheduled for biopsy at her earliest convenience by the schedulers. Ordering provider will be notified. If applicable, a reminder letter will be sent to the patient regarding the next appointment. BI-RADS CATEGORY  4: Suspicious. Electronically Signed   By: Meda Klinefelter M.D.   On: 03/09/2023 11:57   MM 3D SCREENING MAMMOGRAM BILATERAL BREAST Result Date: 03/04/2023 CLINICAL DATA:  Screening. EXAM: DIGITAL SCREENING BILATERAL MAMMOGRAM WITH TOMOSYNTHESIS AND CAD TECHNIQUE: Bilateral screening digital craniocaudal and mediolateral oblique mammograms were obtained. Bilateral screening digital breast tomosynthesis was performed. The images were evaluated with computer-aided detection. COMPARISON:  Previous exam(s). ACR Breast Density Category b: There are scattered areas of fibroglandular density. FINDINGS: In the right breast, calcifications warrant further evaluation with magnified views. In the left breast, no findings suspicious for malignancy. IMPRESSION: Further evaluation is suggested for calcifications in the right breast. RECOMMENDATION: Diagnostic mammogram of the right breast. (Code:[id number]) The patient will be contacted regarding the findings, and additional imaging will be scheduled. BI-RADS CATEGORY  0: Incomplete: Need additional imaging evaluation. Electronically Signed   By: Harmon Pier M.D.   On: 03/04/2023 09:50    Assessment and plan- Patient is a 53 y.o.  female referred for new diagnosis of right breast DCIS ER negative  Discussed the results of mammogram and pathology with the patient in detail which was consistent with 10 mm group of calcifications in her right breast.  Pathology showed high-grade DCIS ER negative.  Given her family history of breast cancer bilateral breast MRI was recommended by radiology which I will order.  I am also referring patient for genetic counseling.  If MRI does not show any additional concerning findings she will proceed with upfront lumpectomy and I will see her back in about 6 weeks time to discuss final pathology results.  I plan to repeat ER/PR testing on final pathology as well.  If it is confirmed to be ER negative she does not require any adjuvant endocrine therapy.  No role for adjuvant chemotherapy and DCIS.  She will benefit from adjuvant radiation therapy and I will have her see Dr. Aggie Cosier from radiation oncology as well in 6 weeks.  Treatment will be given with a curative intent.   Cancer Staging  Ductal carcinoma in situ (DCIS) of right breast Staging form: Breast, AJCC 8th Edition - Clinical stage from 03/22/2023: Stage 0 (cTis (DCIS), cN0(f), cM0, G3, ER-, PR-, HER2: Not Assessed) - Signed by Creig Hines, MD on 03/23/2023 Stage prefix: Initial diagnosis Method of lymph node assessment: Core biopsy Nuclear grade: G3 Histologic grading system: 3 grade system     Thank you for this kind referral and the opportunity to participate in the care of this patient   Visit Diagnosis 1. Ductal carcinoma in situ (DCIS) of right breast     Dr. Owens Shark, MD, MPH [facility] at [facility] [phone] 03/23/2023

## 2023-03-23 NOTE — Progress Notes (Signed)
 Spoke to [facility] to see if they could add an expanded breast panel.   They said she would need to meet with a genetic counselor.  Appt. Has been set up for Friday.

## 2023-03-25 ENCOUNTER — Inpatient Hospital Stay: Payer: Commercial Managed Care - PPO | Admitting: Genetic Counselor

## 2023-03-25 ENCOUNTER — Encounter: Payer: Self-pay | Admitting: Genetic Counselor

## 2023-03-25 DIAGNOSIS — D0511 Intraductal carcinoma in situ of right breast: Secondary | ICD-10-CM

## 2023-03-25 DIAGNOSIS — Z803 Family history of malignant neoplasm of breast: Secondary | ICD-10-CM

## 2023-03-25 NOTE — Progress Notes (Signed)
 REFERRING PROVIDER: Creig Hines, MD [address] [place],  [place] [address]  PRIMARY PROVIDER:  Lauro Regulus, MD  PRIMARY REASON FOR VISIT:  1. Ductal carcinoma in situ (DCIS) of right breast   2. Family history of malignant neoplasm of breast      HISTORY OF PRESENT ILLNESS:   Wendy Russell, a 53 y.o. female, was seen for a [facility] cancer genetics consultation at the request of Dr. Smith Robert due to a personal and family history of cancer.  Wendy Russell presents to clinic today to discuss the possibility of a hereditary predisposition to cancer, genetic testing, and to further clarify her future cancer risks, as well as potential cancer risks for family members.   In 2025, at the age of 36, Wendy Russell was diagnosed with DCIS of the right breast. She is tentatively scheduled for partial mastectomy on 04/15/23, but genetic testing has been requested urgently to help with treatment planning.   Of note, Wendy Russell has participated in [employer] study, which did include analysis of the BRCA, Lynch and familial hypercholesterolemia genes. This screen did not identify any mutations known to increase the risk for cancer.   CANCER HISTORY:  Oncology History  Ductal carcinoma in situ (DCIS) of right breast  03/22/2023 Cancer Staging   Staging form: Breast, AJCC 8th Edition - Clinical stage from 03/22/2023: Stage 0 (cTis (DCIS), cN0(f), cM0, G3, ER-, PR-, HER2: Not Assessed) - Signed by Creig Hines, MD on 03/23/2023 Stage prefix: Initial diagnosis Method of lymph node assessment: Core biopsy Nuclear grade: G3 Histologic grading system: 3 grade system   03/23/2023 Initial Diagnosis   Ductal carcinoma in situ (DCIS) of right breast    RISK FACTORS:  Menarche was at age 48.  First live birth at age 51.  OCP use for approximately 2 years.  Ovaries intact: yes.  Hysterectomy: yes. 2019 Menopausal status: postmenopausal.  HRT use: 0 years. Colonoscopy: yes;  patient reports three  benign polyps total over two colonoscopies . Mammogram within the last year: yes. Number of breast biopsies: 1. Up to date with pelvic exams: no. Any excessive radiation exposure in the past: no. Of note, does report working as a Sales executive, administered x rays   Past Medical History:  Diagnosis Date   Anemia    H/O   Anxiety    Arthritis    HANDS   Breast cancer (HCC)    Claustrophobia    COVID 10/2019   monocloncal atb and got it 10/09/2019   Dizziness    Elevated blood pressure, situational    ASS WITH MIGRAINES-NO MEDS   Family history of adverse reaction to anesthesia    MOM AND SISTER-HARD TIME WAKING UP(MOM) AND UNUSURE OF SISTERS REACTION   GERD (gastroesophageal reflux disease)    OCC-TUMS PRN   Macrocytosis    SEES HEMATOLOGIST DR RAO IN [facility]   Migraines    MIGRAINES    Past Surgical History:  Procedure Laterality Date   BREAST BIOPSY Right 03/14/2023   Stereo bx, x-clip, path pending   BREAST BIOPSY Right 03/14/2023   MM RT BREAST BX W LOC DEV 1ST LESION IMAGE BX SPEC STEREO GUIDE 03/14/2023 [facility]   CESAREAN SECTION     X2   DILITATION & CURRETTAGE/HYSTROSCOPY WITH NOVASURE ABLATION N/A 07/08/2017   Procedure: DILATATION & CURETTAGE/HYSTEROSCOPY WITH NOVASURE ABLATION;  Surgeon: Suzy Bouchard, MD;  Location: [facility];  Service: Gynecology;  Laterality: N/A;   HEMORRHOID SURGERY N/A 09/22/2018  Procedure: OPEN HEMORRHOIDECTOMY;  Surgeon: Sung Amabile, DO;  Location: [facility];  Service: General;  Laterality: N/A;   LAPAROSCOPIC BILATERAL SALPINGECTOMY Bilateral 11/22/2017   Procedure: LAPAROSCOPIC BILATERAL SALPINGECTOMY;  Surgeon: Feliberto Gottron Ihor Austin, MD;  Location: [facility];  Service: Gynecology;  Laterality: Bilateral;   LAPAROSCOPIC SUPRACERVICAL HYSTERECTOMY N/A 11/22/2017   Procedure: LAPAROSCOPIC SUPRACERVICAL HYSTERECTOMY;  Surgeon: Schermerhorn, Ihor Austin, MD;  Location: [facility];  Service: Gynecology;  Laterality: N/A;    TUBAL LIGATION  1993   WISDOM TOOTH EXTRACTION      Social History   Socioeconomic History   Marital status: Married    Spouse name: Not on file   Number of children: Not on file   Years of education: Not on file   Highest education level: Not on file  Occupational History   Not on file  Tobacco Use   Smoking status: Every Day    Current packs/day: 0.50    Average packs/day: 0.5 packs/day for 23.0 years (11.5 ttl pk-yrs)    Types: Cigarettes   Smokeless tobacco: Never   Tobacco comments:    now to 5 cig. a day and dec. by 1 cig every 3 days  Vaping Use   Vaping status: Never Used  Substance and Sexual Activity   Alcohol use: Yes    Comment: RARE WINE   Drug use: No   Sexual activity: Yes  Other Topics Concern   Not on file  Social History Narrative   Not on file   Social Drivers of Health   Financial Resource Strain: Not on file  Food Insecurity: No Food Insecurity (03/22/2023)   Hunger Vital Sign    Worried About Running Out of Food in the Last Year: Never true    Ran Out of Food in the Last Year: Never true  Transportation Needs: No Transportation Needs (03/22/2023)   PRAPARE - Administrator, Civil Service (Medical): No    Lack of Transportation (Non-Medical): No  Physical Activity: Not on file  Stress: Not on file  Social Connections: Not on file     FAMILY HISTORY:  We obtained a detailed, 4-generation family history.  Significant diagnoses are listed below: Family History  Problem Relation Age of Onset   Breast cancer Mother 40    Wendy Russell is unaware of previous family history of genetic testing for hereditary cancer risks. There is no reported Ashkenazi Jewish ancestry. There is no known consanguinity.   Wendy Russell reports her mother was diagnosed with breast cancer at age 37, deceased. She reports her mother also had a hysterectomy, but is unsure if this was due to gynecologic cancer or due to other reasons. She reports a maternal first  cousin once removed diagnosed with breast cancer. She reports three maternal great uncles diagnosed with lung cancer, one maternal great aunt with stomach cancer, and one maternal great aunt with metastatic cancer (primary unknown). She does not report any additional family history of tumors or cancer, but did share that she does not have any information on paternal health history.     GENETIC COUNSELING ASSESSMENT: Ms. Seeney is a 53 y.o. female with a personal and family history of cancer which is suggestive of an inherited predisposition to cancer. We, therefore, discussed and recommended the following at today's visit.   DISCUSSION: We discussed that, in general, most cancer is not inherited in families, but instead is sporadic or familial. Sporadic cancers occur by chance and typically happen at older ages (>50 years) as  this type of cancer is caused by genetic changes acquired during an individual's lifetime. Some families have more cancers than would be expected by chance; however, the ages or types of cancer are not consistent with a known genetic mutation or known genetic mutations have been ruled out. This type of familial cancer is thought to be due to a combination of multiple genetic, environmental, hormonal, and lifestyle factors. While this combination of factors likely increases the risk of cancer, the exact source of this risk is not currently identifiable or testable.  We discussed that 5 - 10% of cancer is hereditary. We discussed that testing is beneficial for several reasons including knowing how to follow individuals after completing their treatment, identifying whether potential treatment options such as PARP inhibitors would be beneficial, and understand if other family members could be at risk for cancer and allow them to undergo genetic testing.   We reviewed the characteristics, features and inheritance patterns of hereditary cancer syndromes. We also discussed genetic testing,  including the appropriate family members to test, the process of testing, insurance coverage and turn-around-time for results. We discussed the implications of a negative, positive, carrier and/or variant of uncertain significant result. Ms. Setzer  was offered a common hereditary cancer panel (36+ genes) and an expanded pan-cancer panel (70+ genes). Ms. Overturf was informed of the benefits and limitations of each panel, including that expanded pan-cancer panels contain genes that do not have clear management guidelines at this point in time.  We also discussed that as the number of genes included on a panel increases, the chances of variants of uncertain significance increases. Ms. Belleau decided to pursue genetic testing for the 13 gene BRCAPlus and 76 gene CAncerNext-Expanded +RNAinsight panel. The CancerNext-Expanded gene panel offered by [facility] and includes sequencing, rearrangement, and RNA analysis for the following 76 genes: AIP, ALK, APC, ATM, AXIN2, BAP1, BARD1, BMPR1A, BRCA1, BRCA2, BRIP1, CDC73, CDH1, CDK4, CDKN1B, CDKN2A, CEBPA, CHEK2, CTNNA1, DDX41, DICER1, ETV6, FH, FLCN, GATA2, LZTR1, MAX, MBD4, MEN1, MET, MLH1, MSH2, MSH3, MSH6, MUTYH, NF1, NF2, NTHL1, PALB2, PHOX2B, PMS2, POT1, PRKAR1A, PTCH1, PTEN, RAD51C, RAD51D, RB1, RET, RUNX1, SDHA, SDHAF2, SDHB, SDHC, SDHD, SMAD4, SMARCA4, SMARCB1, SMARCE1, STK11, SUFU, TMEM127, TP53, TSC1, TSC2, VHL, and WT1 (sequencing and deletion/duplication); EGFR, HOXB13, KIT, MITF, PDGFRA, POLD1, and POLE (sequencing only); EPCAM and GREM1 (deletion/duplication only).   Based on Ms. Schmeling's personal and family history of cancer, she meets medical criteria for genetic testing.  Despite that she meets criteria, she may still have an out of pocket cost. We discussed that if her out of pocket cost for testing is over $100, the laboratory will call and confirm whether she wants to proceed with testing.  If the out of pocket cost of testing is less than $100 she  will be billed by the genetic testing laboratory.   We discussed that some people do not want to undergo genetic testing due to fear of genetic discrimination.  The Genetic Information Nondiscrimination Act (GINA) was signed into federal law in 2008. GINA prohibits health insurers and most employers from discriminating against individuals based on genetic information (including the results of genetic tests and family history information). According to GINA, health insurance companies cannot consider genetic information to be a preexisting condition, nor can they use it to make decisions regarding coverage or rates. GINA also makes it illegal for most employers to use genetic information in making decisions about hiring, firing, promotion, or terms of employment. It is important to note  that GINA does not offer protections for life insurance, disability insurance, or long-term care insurance. GINA does not apply to those in the [employer], those who work for companies with less than 15 employees, and new life insurance or long-term disability insurance policies.  Health status due to a cancer diagnosis is not protected under GINA. More information about GINA can be found by visiting [email or web address].  We reviewed the characteristics, features and inheritance patterns of hereditary cancer syndromes. We also discussed genetic testing, including the appropriate family members to test, the process of testing, insurance coverage and turn-around-time for results. We discussed the implications of a negative, positive and/or variant of uncertain significant result. In order to get genetic test results in a timely manner so that Ms. Mailhot can use these genetic test results for surgical decisions, we recommended Ms. Mayfield pursue genetic testing for the BRCAPlus panel. Once complete, we recommend Ms. Gotcher pursue reflex genetic testing to the CancerNext-Expanded gene panel.   PLAN: After considering the risks, benefits,  and limitations, Ms. Sweetin provided informed consent to pursue genetic testing and the blood sample will be sent to [facility] for analysis of the BRCAPlus and CancerNext-Expanded +RNA tests. Blood work scheduled at [facility] for 03/28/23. Results should be available within approximately 7-10 days for the [facility] panel and 3 weeks' time for the expanded panel, at which point they will be disclosed by telephone to Ms. Casco, as will any additional recommendations warranted by these results. Ms. Hohn will receive a summary of her genetic counseling visit and a copy of her results once available. This information will also be available in Epic.   Lastly, we encouraged Ms. Rosensteel to remain in contact with cancer genetics annually so that we can continuously update the family history and inform her of any changes in cancer genetics and testing that may be of benefit for this family.   Ms. Denzler questions were answered to her satisfaction today. Our contact information was provided should additional questions or concerns arise. Thank you for the referral and allowing [place] to share in the care of your patient.   Vassie Moment, MS, [facility] Licensed, Retail [email or web address] phone: [phone]  40 minutes were spent on the date of the encounter in service to the patient including preparation, face-to-face consultation, documentation and care coordination.   The patient was seen alone.  Drs. Meliton Rattan, and/or [place] were available for questions, if needed..   I connected with  Ms. Genna on 03/25/2023 at 1:38 EDT by MyChart video conference and verified that I am speaking with the correct person using two identifiers.   Patient location: home Provider location: office  _______________________________________________________________________ For Office Staff:  Number of people involved in session: 1 Was an Intern/ student involved with case: no

## 2023-03-27 ENCOUNTER — Ambulatory Visit
Admission: RE | Admit: 2023-03-27 | Discharge: 2023-03-27 | Disposition: A | Discharge status: Home / Self Care | Payer: Commercial Managed Care - PPO | Source: Ambulatory Visit | Attending: Oncology | Admitting: Oncology

## 2023-03-27 DIAGNOSIS — D0511 Intraductal carcinoma in situ of right breast: Secondary | ICD-10-CM | POA: Diagnosis present

## 2023-03-27 MED ORDER — GADOBUTROL 1 MMOL/ML IV SOLN
7.5000 mL | Freq: Once | INTRAVENOUS | Status: AC | PRN
  Administered 2023-03-27: 7.5 mL via INTRAVENOUS

## 2023-03-28 ENCOUNTER — Inpatient Hospital Stay: Payer: Commercial Managed Care - PPO

## 2023-03-28 DIAGNOSIS — Z803 Family history of malignant neoplasm of breast: Secondary | ICD-10-CM

## 2023-03-28 DIAGNOSIS — D0511 Intraductal carcinoma in situ of right breast: Secondary | ICD-10-CM

## 2023-03-28 LAB — GENETIC SCREENING ORDER

## 2023-03-30 ENCOUNTER — Encounter: Payer: Self-pay | Admitting: Occupational Therapy

## 2023-03-30 ENCOUNTER — Ambulatory Visit: Payer: Commercial Managed Care - PPO | Attending: Oncology | Admitting: Occupational Therapy

## 2023-03-30 DIAGNOSIS — R293 Abnormal posture: Secondary | ICD-10-CM | POA: Insufficient documentation

## 2023-03-30 NOTE — Therapy (Signed)
 OUTPATIENT OCCUPATIONAL THERAPY BREAST CANCER BASELINE EVALUATION   Patient Name: Wendy Russell MRN: [id number] DOB:07/25/1970, 53 y.o., female Today's Date: 03/30/2023  END OF SESSION:  OT End of Session - 03/30/23 2008     Visit Number 1    Number of Visits 6    Date for OT Re-Evaluation 05/11/23    OT Start Time 1032    OT Stop Time 1103    OT Time Calculation (min) 31 min    Activity Tolerance Patient tolerated treatment well    Behavior During Therapy WFL for tasks assessed/performed             Past Medical History:  Diagnosis Date   Anemia    H/O   Anxiety    Arthritis    HANDS   Breast cancer (HCC)    Claustrophobia    COVID 10/2019   monocloncal atb and got it 10/09/2019   Dizziness    Elevated blood pressure, situational    ASS WITH MIGRAINES-NO MEDS   Family history of adverse reaction to anesthesia    MOM AND SISTER-HARD TIME WAKING UP(MOM) AND UNUSURE OF SISTERS REACTION   GERD (gastroesophageal reflux disease)    OCC-TUMS PRN   Macrocytosis    SEES HEMATOLOGIST DR RAO IN CANCER CENTER   Migraines    MIGRAINES   Past Surgical History:  Procedure Laterality Date   BREAST BIOPSY Right 03/14/2023   Stereo bx, x-clip, path pending   BREAST BIOPSY Right 03/14/2023   MM RT BREAST BX W LOC DEV 1ST LESION IMAGE BX SPEC STEREO GUIDE 03/14/2023 [facility]   CESAREAN SECTION     X2   DILITATION & CURRETTAGE/HYSTROSCOPY WITH NOVASURE ABLATION N/A 07/08/2017   Procedure: DILATATION & CURETTAGE/HYSTEROSCOPY WITH NOVASURE ABLATION;  Surgeon: Suzy Bouchard, MD;  Location: [facility];  Service: Gynecology;  Laterality: N/A;   HEMORRHOID SURGERY N/A 09/22/2018   Procedure: OPEN HEMORRHOIDECTOMY;  Surgeon: Sung Amabile, DO;  Location: [facility];  Service: General;  Laterality: N/A;   LAPAROSCOPIC BILATERAL SALPINGECTOMY Bilateral 11/22/2017   Procedure: LAPAROSCOPIC BILATERAL SALPINGECTOMY;  Surgeon: Suzy Bouchard, MD;  Location: [facility];   Service: Gynecology;  Laterality: Bilateral;   LAPAROSCOPIC SUPRACERVICAL HYSTERECTOMY N/A 11/22/2017   Procedure: LAPAROSCOPIC SUPRACERVICAL HYSTERECTOMY;  Surgeon: Schermerhorn, Ihor Austin, MD;  Location: [facility];  Service: Gynecology;  Laterality: N/A;   TUBAL LIGATION  1993   WISDOM TOOTH EXTRACTION     Patient Active Problem List   Diagnosis Date Noted   Ductal carcinoma in situ (DCIS) of right breast 03/23/2023   Left lower quadrant pain 07/26/2018   Headache disorder 04/03/2018   Post-operative state 11/22/2017   Healthcare maintenance 06/17/2017   Migraine without aura and without status migrainosus, not intractable 12/08/2015   Anxiety, generalized 12/05/2015    PCP: Dr Delano Metz PROVIDER: Dr Smith Robert  REFERRING DIAG: R breast Cancer  THERAPY DIAG:  Abnormal posture  Rationale for Evaluation and Treatment:   ONSET DATE: 03/09/23  SUBJECTIVE:  SUBJECTIVE STATEMENT: Patient reports she is here today after being refer by one of her medical team for her newly diagnosed right breast cancer.   PERTINENT HISTORY:  Patient was diagnosed with right  breast cancer - plan is to have R lumpectomy on 04/15/2023 by Dr. Tonna Boehringer.  Radiation to follow.  Await results from surgery for decision on chemo.  PATIENT GOALS:   reduce lymphedema risk and learn post op HEP.   PAIN:  Are you having pain? No  PRECAUTIONS: Active CA     HAND DOMINANCE: right  WEIGHT BEARING RESTRICTIONS: No  FALLS:  Has patient fallen in last 6 months? No  LIVING ENVIRONMENT: Patient lives with: Several family members-daughter grandkids mother-in-law.  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  LEISURE: Does all home making activities.  Loves to do  crocheting and knitting and photography -assist family   OBJECTIVE:  COGNITION: Overall cognitive status: Within functional limits for tasks assessed    POSTURE:  Forward head and rounded shoulders posture  UPPER EXTREMITY AROM/PROM:   Bilateral shoulder active range of motion within normal limits. CERVICAL AROM: All within normal limits:    UPPER EXTREMITY STRENGTH: 5/5 for shoulder in all planes  LYMPHEDEMA ASSESSMENTS:   LANDMARK RIGHT   eval  10 cm proximal to olecranon process 34.5 at 15 cm  31.5  Olecranon process 25.5  10 cm proximal to ulnar styloid process   Just proximal to ulnar styloid process   Across hand at thumb web space   At base of 2nd digit   (Blank rows = not tested)  LANDMARK LEFT   eval  10 cm proximal to olecranon process 33.4 cm at 15 cm  30 cm  Olecranon process 24.5  10 cm proximal to ulnar styloid process   Just proximal to ulnar styloid process   Across hand at thumb web space   At base of 2nd digit   (Blank rows = not tested)  L-DEX LYMPHEDEMA SCREENING:  The patient was assessed using the L-Dex machine today to produce a lymphedema index baseline score. The patient will be reassessed on a regular basis (typically every 3 months) to obtain new L-Dex scores. If the score is > 6.5 points away from his/her baseline score indicating onset of subclinical lymphedema, it will be recommended to wear a compression garment for 4 weeks, 12 hours per day and then be reassessed. If the score continues to be > 6.5 points from baseline at reassessment, we will initiate lymphedema treatment. Assessing in this manner has a 95% rate of preventing clinically significant lymphedema.   L-DEX FLOWSHEETS - 03/30/23 2000       L-DEX LYMPHEDEMA SCREENING   Measurement Type Unilateral    L-DEX MEASUREMENT EXTREMITY Upper Extremity    POSITION  Standing    DOMINANT SIDE Right    At Risk Side Right    BASELINE SCORE (UNILATERAL) 6.4   on yellow border              PATIENT EDUCATION:  Education details: Lymphedema risk reduction initiated will review after surgery and post op shoulder/posture HEP Person educated: Patient Education method: Explanation, Demonstration, Handout Education comprehension: Patient verbalized understanding and returned demonstration  HOME EXERCISE PROGRAM: Patient was instructed today in a home exercise program today for post op shoulder range of motion. These included active assist shoulder flexion and abduction in supine as well as external rotation in supine.  She was encouraged to do these 2-3 x day, holding 3 seconds and repeating 10 times when permitted by her physician/surgeon.  Patient to keep it under 90 degrees until [facility] by physician.  Keep exercises pain-free.   ASSESSMENT:  CLINICAL IMPRESSION: Her multidisciplinary medical team has met to assess and determine a recommended treatment plan. She is planning to have right lumpectomy by Dr. Tonna Boehringer on 04/15/2023.Marland Kitchen She will benefit from a post op OT reassessment to determine needs and from L-Dex screens every 3 months for 2 years to detect subclinical lymphedema.  Pt will benefit from skilled therapeutic intervention to improve on the following deficits: Decreased knowledge of precautions and lymphedema education, impaired UE functional use, pain, decreased ROM, postural dysfunction.   OT treatment/interventions: ADL/self-care home management, pt/family education, therapeutic exercise,manual therapy  REHAB POTENTIAL: Good  CLINICAL DECISION MAKING: Stable/uncomplicated  EVALUATION COMPLEXITY: Low   GOALS: Goals reviewed with patient? YES  LONG TERM GOALS: (STG=LTG)    Name Target Date Goal status  1 Pt will be able to verbalize understanding of pertinent lymphedema risk reduction practices relevant to her dx specifically related to skin care.  Baseline:  No knowledge 6 weeks Initially  2 Pt will be able to return demo and/or verbalize  understanding of the post op HEP related to regaining shoulder ROM. Baseline:  No knowledge 6 weeks Initially       4 Pt will demo she has regained full shoulder ROM and function post operatively compared to baselines.  Baseline: See objective measurements taken today. 6 weeks Initial    PLAN:  OT FREQUENCY/DURATION: EVAL and 4 follow up appointment.   PLAN FOR NEXT SESSION: will reassess 3-4 weeks post op to determine needs.   Patient will follow up at outpatient cancer rehab 3-4 weeks following surgery.  If the patient requires occupational therapy at that time, a specific plan will be dictated and sent to the referring physician for approval. T Occupational Therapy Information for After Breast Cancer Surgery/Treatment:  Lymphedema is a swelling condition that you may be at risk for in your arm if you have lymph nodes removed from the armpit area.  After a sentinel node biopsy, the risk is approximately 5-9% and is higher after an axillary node dissection.  There is treatment available for this condition and it is not life-threatening.  Contact your physician or occupational therapist with concerns. You may begin the 4 shoulder/posture exercises (see additional sheet) when permitted by your physician (typically a week after surgery).  If you have drains, you may need to wait until those are removed before beginning range of motion exercises.  A general recommendation is to not lift your arms above shoulder height until drains are removed.  These exercises should be done to your tolerance and gently.  This is not a "no pain/no gain" type of recovery so listen to your body and stretch into the range of motion that you can tolerate, stopping if you have pain.  If you are having immediate reconstruction, ask your plastic surgeon about doing exercises as he or she may want you to wait. .  While undergoing any medical procedure or treatment, try to avoid blood pressure being taken or needle sticks  from occurring on the arm on the side of cancer.   This recommendation begins after surgery and continues for the rest of your life.  This may help reduce your risk of getting lymphedema (swelling in your arm). An excellent resource for those seeking information on lymphedema is  the National Lymphedema Network's web site. It can be accessed at www.lymphnet.org If you notice swelling in your hand, arm or breast at any time following surgery (even if it is many years from now), please contact your doctor or occupational therapist to discuss this.  Lymphedema can be treated at any time but it is easier for you if it is treated early on.  If you feel like your shoulder motion is not returning to normal in a reasonable amount of time, please contact your surgeon or occupational therapist.  [facility] [phone]. [address], [place], [place] [address]      Oletta Cohn, OTR/L,CLT 03/30/2023, 8:10 PM

## 2023-04-04 ENCOUNTER — Encounter: Payer: Self-pay | Admitting: Genetic Counselor

## 2023-04-04 ENCOUNTER — Telehealth: Payer: Self-pay | Admitting: Genetic Counselor

## 2023-04-04 NOTE — Telephone Encounter (Signed)
 I spoke to Ms. Eick to review results of genetic testing. STAT panel negative, no mutations identified in the 13 genes included on this test. Results uploaded to labs tab. Full CancerNext-Expanded panel still running, results expected in 1-2 weeks. Will re-contact when available. All Ms. Hindes's questions answered at this time.

## 2023-04-05 ENCOUNTER — Ambulatory Visit
Admission: RE | Admit: 2023-04-05 | Discharge: 2023-04-05 | Disposition: A | Discharge status: Home / Self Care | Payer: Commercial Managed Care - PPO | Source: Ambulatory Visit | Attending: Surgery | Admitting: Surgery

## 2023-04-05 DIAGNOSIS — D0511 Intraductal carcinoma in situ of right breast: Secondary | ICD-10-CM | POA: Diagnosis present

## 2023-04-05 HISTORY — PX: BREAST BIOPSY: SHX20

## 2023-04-05 MED ORDER — LIDOCAINE HCL (PF) 1 % IJ SOLN
10.0000 mL | Freq: Once | INTRAMUSCULAR | Status: AC
  Administered 2023-04-05: 10 mL
  Filled 2023-04-05: qty 10

## 2023-04-06 ENCOUNTER — Ambulatory Visit: Payer: Self-pay | Admitting: Surgery

## 2023-04-06 NOTE — H&P (Signed)
 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 female who was referred by Joesphine Bare* for evaluation of above. Change was noted on last screening mammogram. Age of menarche was 49. Hysterectomy in 2019. Patient denies hormonal therapy. Patient is G2P2. Age of first live birth was 65. Patient did breast feed.Patient denies previous breast biopsy. Patient denies a personal history of breast cancer.  Past Medical History: has a past medical history of Abnormal vaginal bleeding, Allergy, Anxiety, generalized, and Migraine.  Past Surgical History: has a past surgical history that includes Cesarean section; Fractional D&C, hysteroscopy and Novasure (07/08/2017); Dilation and curettage of uterus (07/08/2017); Endometrial ablation (07/08/2017); Tubal ligation; LSH with bilateral salpingectomy (11/22/2017); Hysterectomy (11/22/2017); Hemorrhoidectomy By Simple Ligation (09/2018); Colonoscopy (01/19/2019); and Colon @ [facility] (12/06/2022).  Family History: family history includes Breast cancer in her mother; Diabetes type II in her father; Migraines in her sister; Ovarian cancer in her mother.  Social History: reports that she has been smoking cigarettes. She has never used smokeless tobacco. She reports current alcohol use. She reports that she does not use drugs.  Current Medications: has a current medication list which includes the following prescription(s): azelastine, cetirizine, escitalopram oxalate, gabapentin, ibuprofen, and multivitamin.  Allergies:  Allergies as of 03/21/2023 - Reviewed 03/21/2023  Allergen Reaction Noted  Codeine Anaphylaxis 10/24/2015  Aspirin Other (See Comments) 10/24/2015  Cat dander Itching 03/04/2023  Doxycycline Vomiting 12/01/2016  Tramadol Other (See Comments) 11/28/2017  Dog dander Itching 03/04/2023   ROS:  A 15 point review of systems was performed and was negative except as noted in  HPI  Objective:    BP (!) 140/84  Pulse 70  Ht 149.9 cm (4\' 11" )  Wt 73.5 kg (162 lb 0.6 oz)  LMP 03/17/2017 (Approximate)  BMI 32.73 kg/m   Constitutional : No distress, cooperative, alert  Lymphatics/Throat: Supple with no lymphadenopathy  Respiratory: Clear to auscultation bilaterally  Cardiovascular: Regular rate and rhythm  Gastrointestinal: Soft, non-tender, non-distended, no organomegaly.  Musculoskeletal: Steady gait and movement  Skin: Cool and moist  Psychiatric: Normal affect, non-agitated, not confused  Breast: Normal appearance and no palpable abnormality in bilateral breasts and axilla. Chaperone present for exam.    LABS:   SURGICAL PATHOLOGY SURGICAL PATHOLOGY [facility] [address] [place], [place] [address] Telephone [phone] or [phone] Fax [phone]  REPORT OF SURGICAL PATHOLOGY  Accession #: [id number] Patient Name: Wendy Russell Visit # : [id number]  MRN: [id number] Physician: Meda Klinefelter DOB/Age [birth year] (Age: 89) Gender: F Collected Date: 03/14/2023 Received Date: 03/14/2023  FINAL DIAGNOSIS  1. Breast, right, needle core biopsy, Upper outer breast middle depth (x clip) : DUCTAL CARCINOMA IN SITU, HIGH GRADE, SOLID AND CRIBRIFORM TYPES NECROSIS: PRESENT CALCIFICATIONS: PRESENT DCIS LENGTH: 0.3 CM SEE NOTE  Diagnosis Note : Dr. Luisa Hart reviewed the case and concurs with the interpretation. A breast prognostic profile (ER and PR) is pending and will be reported in an addendum.  DATE SIGNED OUT: 03/15/2023 ELECTRONIC SIGNATURE : Lance Coon Md, Pathologist, Electronic Signature  MICROSCOPIC DESCRIPTION  CASE COMMENTS STAINS USED IN DIAGNOSIS: H&E-2 H&E-3 H&E-4 H&E H&E-2 H&E-3 H&E-4 H&E *RECUT 1 SLIDE Stains used in diagnosis 1 ER-ACIS, 1 PR-ACIS Estrogen receptor (6F11), immunohistochemical stains are performed on formalin fixed, paraffin embedded tissue using a  3,3"-diaminobenzidine (DAB) chromogen and Leica Bond Autostainer System. The staining intensity of the nucleus is scored manually and is reported as  the percentage of tumor cell nuclei demonstrating specific nuclear staining.Specimens are fixed in 10% Neutral Buffered Formalin for at least 6 hours and up to 72 hours. These tests have not be validated on decalcified tissue. Results should be interpreted with caution given the possibility of false negative results on decalcified specimens. PR progesterone receptor (16), immunohistochemical stains are performed on formalin fixed, paraffin embedded tissue using a 3,3"-diaminobenzidine (DAB) chromogen and Leica Bond Autostainer System. The staining intensity of the nucleus is scored manually and is reported as the percentage of tumor cell nuclei demonstrating specific nuclear staining.Specimens are fixed in 10% Neutral Buffered Formalin for at least 6 hours and up to 72 hours. These tests have not be validated on decalcified tissue. Results should be interpreted with caution given the possibility of false negative results on decalcified specimens.  ADDENDUM 1A) Breast, right, needle core biopsy, upper outer breast middle depth (x clip) PROGNOSTIC INDICATORS  Results: IMMUNOHISTOCHEMICAL AND MORPHOMETRIC ANALYSIS PERFORMED MANUALLY Estrogen Receptor: 0%, NEGATIVE Progesterone Receptor: 0%, NEGATIVE  COMMENT: The negative hormone receptor study(ies) in this case has an internal positive control.  REFERENCE RANGE ESTROGEN RECEPTOR NEGATIVE 0% POSITIVE =>1% REFERENCE RANGE PROGESTERONE RECEPTOR NEGATIVE 0% POSITIVE =>1% All controls stained appropriately Jacelyn Grip, John, Pathologist, Electronic Signature ( Signed [phone])  CLINICAL HISTORY  SPECIMEN(S) OBTAINED 1. Breast, right, needle core biopsy, Upper Outer Breast Middle Depth (x Clip)  SPECIMEN COMMENTS: 1. TIF: 13:00pm SPECIMEN CLINICAL INFORMATION: 1. FA, FCC,  Malignancy  Gross Description 1. Received in formalin in a carousel container, labeled with the patient's name and right breast stereotactic biopsy calcs upper outer middle depth, and consists of multiple core and core fragments of tan yellow fibroadipose tissue. A 1.5 x 1.5 x 0.3 cm aggregate of tissue is designated as containing microcalcifications on the specimen lid and is submitted in block A. The remaining 2.0 x 1.4 x 0.3 cm aggregate of tissue is submitted in block B. TIF 1:00 p.m. on 03/14/23. CIT less than 3 mins. (KL:gt, 03/15/23)  Report signed out from the following location(s) [place] [address], [place] [address] CLIA #: [id number]  [facility] [address] [place], [place] [address] CLIA #: [id number]   RADS: CLINICAL DATA: RIGHT breast calcification callback. History of  mother diagnosed with breast cancer at 35   EXAM:  DIGITAL DIAGNOSTIC UNILATERAL RIGHT MAMMOGRAM WITH TOMOSYNTHESIS AND  CAD   TECHNIQUE:  Right digital diagnostic mammography and breast tomosynthesis was  performed. The images were evaluated with computer-aided detection.   COMPARISON: Previous exam(s).   ACR Breast Density Category b: There are scattered areas of  fibroglandular density.   FINDINGS:  Spot magnification views of the RIGHT breast demonstrate a 10 mm  group of pleomorphic calcifications in the RIGHT upper breast at  middle depth. These are not stable in comparison to remote prior  mammograms. No additional suspicious findings are noted.   IMPRESSION:  1. There is an indeterminate 10 mm group of calcifications in the  RIGHT breast. Recommend stereotactic guided biopsy for definitive  characterization.   RECOMMENDATION:  RIGHT breast stereotactic guided biopsy x1   I have discussed the findings and recommendations with the patient.  The biopsy procedure was discussed with the patient and questions  were answered.  Patient expressed their understanding of the biopsy  recommendation. Patient will be scheduled for biopsy at her earliest  convenience by the schedulers. Ordering provider will be notified.  If applicable, a  reminder letter will be sent to the patient  regarding the next appointment.   BI-RADS CATEGORY 4: Suspicious.   Electronically Signed  By: Meda Klinefelter M.D.  On: 03/09/2023 11:57   Addendum by Glennon Mac, MD on 03/16/2023 1:40 PM EST  ADDENDUM REPORT: 03/16/2023 11:39   ADDENDUM:  PATHOLOGY revealed: 1. Breast, right, needle core biopsy, Upper  outer breast middle depth (x clip) : DUCTAL CARCINOMA IN SITU, HIGH  GRADE, SOLID AND CRIBRIFORM TYPES. NECROSIS: PRESENT.  CALCIFICATIONS: PRESENT. DCIS LENGTH: 0.3 CM.   Pathology results are CONCORDANT with imaging findings, per Dr.  Meda Klinefelter.   Pathology results and recommendations were discussed with patient  via telephone on 03/16/2023 by Randa Lynn RN. Patient reported biopsy  site doing well with no adverse symptoms, and only slight tenderness  at the site. Post biopsy care instructions were reviewed, questions  were answered and my direct phone number was provided. Patient was  instructed to call [facility] for any additional  questions or concerns related to biopsy site.   RECOMMENDATIONS: 1. Surgical and oncological consultation. Request  for surgical and oncological consultation relayed to Irving Shows RN at  [facility] by Randa Lynn RN on 03/16/2023.   2. Recommend pretreatment bilateral breast MRI with and without  contrast to determine extent of breast disease given diagnosis of  high grade DCIS and strong family history of breast cancer.   Pathology results reported by Randa Lynn RN on 03/16/2023.   Electronically Signed  By: Meda Klinefelter M.D.  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 The risks of general anesthetic, if used, includes MI, CVA, sudden death or even reaction to anesthetic medications also discussed.  Typical post-op recovery time and possbility of activity restrictions were also discussed. Alternatives include continued observation. Benefits include possible symptom relief, pathologic evaluation, and/or curative excision.   The patient verbalized understanding and all questions were answered to the patient's satisfaction.  2. Patient has elected to proceed with surgical treatment. Procedure will be scheduled. Right lumpectomy after SCOUT tag placement, SLNB if MRI and genetic testing negative. Will discuss additional options if either come back positive.  labs/images/medications/previous chart entries reviewed personally and relevant changes/updates noted above.

## 2023-04-06 NOTE — H&P (View-Only) (Signed)
 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 female who was referred by Joesphine Bare* for evaluation of above. Change was noted on last screening mammogram. Age of menarche was 49. Hysterectomy in 2019. Patient denies hormonal therapy. Patient is G2P2. Age of first live birth was 65. Patient did breast feed.Patient denies previous breast biopsy. Patient denies a personal history of breast cancer.  Past Medical History: has a past medical history of Abnormal vaginal bleeding, Allergy, Anxiety, generalized, and Migraine.  Past Surgical History: has a past surgical history that includes Cesarean section; Fractional D&C, hysteroscopy and Novasure (07/08/2017); Dilation and curettage of uterus (07/08/2017); Endometrial ablation (07/08/2017); Tubal ligation; LSH with bilateral salpingectomy (11/22/2017); Hysterectomy (11/22/2017); Hemorrhoidectomy By Simple Ligation (09/2018); Colonoscopy (01/19/2019); and Colon @ [facility] (12/06/2022).  Family History: family history includes Breast cancer in her mother; Diabetes type II in her father; Migraines in her sister; Ovarian cancer in her mother.  Social History: reports that she has been smoking cigarettes. She has never used smokeless tobacco. She reports current alcohol use. She reports that she does not use drugs.  Current Medications: has a current medication list which includes the following prescription(s): azelastine, cetirizine, escitalopram oxalate, gabapentin, ibuprofen, and multivitamin.  Allergies:  Allergies as of 03/21/2023 - Reviewed 03/21/2023  Allergen Reaction Noted  Codeine Anaphylaxis 10/24/2015  Aspirin Other (See Comments) 10/24/2015  Cat dander Itching 03/04/2023  Doxycycline Vomiting 12/01/2016  Tramadol Other (See Comments) 11/28/2017  Dog dander Itching 03/04/2023   ROS:  A 15 point review of systems was performed and was negative except as noted in  HPI  Objective:    BP (!) 140/84  Pulse 70  Ht 149.9 cm (4\' 11" )  Wt 73.5 kg (162 lb 0.6 oz)  LMP 03/17/2017 (Approximate)  BMI 32.73 kg/m   Constitutional : No distress, cooperative, alert  Lymphatics/Throat: Supple with no lymphadenopathy  Respiratory: Clear to auscultation bilaterally  Cardiovascular: Regular rate and rhythm  Gastrointestinal: Soft, non-tender, non-distended, no organomegaly.  Musculoskeletal: Steady gait and movement  Skin: Cool and moist  Psychiatric: Normal affect, non-agitated, not confused  Breast: Normal appearance and no palpable abnormality in bilateral breasts and axilla. Chaperone present for exam.    LABS:   SURGICAL PATHOLOGY SURGICAL PATHOLOGY [facility] [address] [place], [place] [address] Telephone [phone] or [phone] Fax [phone]  REPORT OF SURGICAL PATHOLOGY  Accession #: [id number] Patient Name: [name], [name] Visit # : [id number]  MRN: [id number] Physician: Meda Klinefelter DOB/Age [birth year] (Age: 89) Gender: F Collected Date: 03/14/2023 Received Date: 03/14/2023  FINAL DIAGNOSIS  1. Breast, right, needle core biopsy, Upper outer breast middle depth (x clip) : DUCTAL CARCINOMA IN SITU, HIGH GRADE, SOLID AND CRIBRIFORM TYPES NECROSIS: PRESENT CALCIFICATIONS: PRESENT DCIS LENGTH: 0.3 CM SEE NOTE  Diagnosis Note : Dr. Luisa Hart reviewed the case and concurs with the interpretation. A breast prognostic profile (ER and PR) is pending and will be reported in an addendum.  DATE SIGNED OUT: 03/15/2023 ELECTRONIC SIGNATURE : Lance Coon Md, Pathologist, Electronic Signature  MICROSCOPIC DESCRIPTION  CASE COMMENTS STAINS USED IN DIAGNOSIS: H&E-2 H&E-3 H&E-4 H&E H&E-2 H&E-3 H&E-4 H&E *RECUT 1 SLIDE Stains used in diagnosis 1 ER-ACIS, 1 PR-ACIS Estrogen receptor (6F11), immunohistochemical stains are performed on formalin fixed, paraffin embedded tissue using a  3,3"-diaminobenzidine (DAB) chromogen and Leica Bond Autostainer System. The staining intensity of the nucleus is scored manually and is reported as  the percentage of tumor cell nuclei demonstrating specific nuclear staining.Specimens are fixed in 10% Neutral Buffered Formalin for at least 6 hours and up to 72 hours. These tests have not be validated on decalcified tissue. Results should be interpreted with caution given the possibility of false negative results on decalcified specimens. PR progesterone receptor (16), immunohistochemical stains are performed on formalin fixed, paraffin embedded tissue using a 3,3"-diaminobenzidine (DAB) chromogen and Leica Bond Autostainer System. The staining intensity of the nucleus is scored manually and is reported as the percentage of tumor cell nuclei demonstrating specific nuclear staining.Specimens are fixed in 10% Neutral Buffered Formalin for at least 6 hours and up to 72 hours. These tests have not be validated on decalcified tissue. Results should be interpreted with caution given the possibility of false negative results on decalcified specimens.  ADDENDUM 1A) Breast, right, needle core biopsy, upper outer breast middle depth (x clip) PROGNOSTIC INDICATORS  Results: IMMUNOHISTOCHEMICAL AND MORPHOMETRIC ANALYSIS PERFORMED MANUALLY Estrogen Receptor: 0%, NEGATIVE Progesterone Receptor: 0%, NEGATIVE  COMMENT: The negative hormone receptor study(ies) in this case has an internal positive control.  REFERENCE RANGE ESTROGEN RECEPTOR NEGATIVE 0% POSITIVE =>1% REFERENCE RANGE PROGESTERONE RECEPTOR NEGATIVE 0% POSITIVE =>1% All controls stained appropriately Jacelyn Grip, John, Pathologist, Electronic Signature ( Signed [phone])  CLINICAL HISTORY  SPECIMEN(S) OBTAINED 1. Breast, right, needle core biopsy, Upper Outer Breast Middle Depth (x Clip)  SPECIMEN COMMENTS: 1. TIF: 13:00pm SPECIMEN CLINICAL INFORMATION: 1. FA, FCC,  Malignancy  Gross Description 1. Received in formalin in a carousel container, labeled with the patient's name and right breast stereotactic biopsy calcs upper outer middle depth, and consists of multiple core and core fragments of tan yellow fibroadipose tissue. A 1.5 x 1.5 x 0.3 cm aggregate of tissue is designated as containing microcalcifications on the specimen lid and is submitted in block A. The remaining 2.0 x 1.4 x 0.3 cm aggregate of tissue is submitted in block B. TIF 1:00 p.m. on 03/14/23. CIT less than 3 mins. (KL:gt, 03/15/23)  Report signed out from the following location(s) [place] [address], [place] [address] CLIA #: [id number]  [facility] [address] [place], [place] [address] CLIA #: [id number]   RADS: CLINICAL DATA: RIGHT breast calcification callback. History of  mother diagnosed with breast cancer at 35   EXAM:  DIGITAL DIAGNOSTIC UNILATERAL RIGHT MAMMOGRAM WITH TOMOSYNTHESIS AND  CAD   TECHNIQUE:  Right digital diagnostic mammography and breast tomosynthesis was  performed. The images were evaluated with computer-aided detection.   COMPARISON: Previous exam(s).   ACR Breast Density Category b: There are scattered areas of  fibroglandular density.   FINDINGS:  Spot magnification views of the RIGHT breast demonstrate a 10 mm  group of pleomorphic calcifications in the RIGHT upper breast at  middle depth. These are not stable in comparison to remote prior  mammograms. No additional suspicious findings are noted.   IMPRESSION:  1. There is an indeterminate 10 mm group of calcifications in the  RIGHT breast. Recommend stereotactic guided biopsy for definitive  characterization.   RECOMMENDATION:  RIGHT breast stereotactic guided biopsy x1   I have discussed the findings and recommendations with the patient.  The biopsy procedure was discussed with the patient and questions  were answered.  Patient expressed their understanding of the biopsy  recommendation. Patient will be scheduled for biopsy at her earliest  convenience by the schedulers. Ordering provider will be notified.  If applicable, a  reminder letter will be sent to the patient  regarding the next appointment.   BI-RADS CATEGORY 4: Suspicious.   Electronically Signed  By: Meda Klinefelter M.D.  On: 03/09/2023 11:57   Addendum by Glennon Mac, MD on 03/16/2023 1:40 PM EST  ADDENDUM REPORT: 03/16/2023 11:39   ADDENDUM:  PATHOLOGY revealed: 1. Breast, right, needle core biopsy, Upper  outer breast middle depth (x clip) : DUCTAL CARCINOMA IN SITU, HIGH  GRADE, SOLID AND CRIBRIFORM TYPES. NECROSIS: PRESENT.  CALCIFICATIONS: PRESENT. DCIS LENGTH: 0.3 CM.   Pathology results are CONCORDANT with imaging findings, per Dr.  Meda Klinefelter.   Pathology results and recommendations were discussed with patient  via telephone on 03/16/2023 by Randa Lynn RN. Patient reported biopsy  site doing well with no adverse symptoms, and only slight tenderness  at the site. Post biopsy care instructions were reviewed, questions  were answered and my direct phone number was provided. Patient was  instructed to call [facility] for any additional  questions or concerns related to biopsy site.   RECOMMENDATIONS: 1. Surgical and oncological consultation. Request  for surgical and oncological consultation relayed to Irving Shows RN at  [facility] by Randa Lynn RN on 03/16/2023.   2. Recommend pretreatment bilateral breast MRI with and without  contrast to determine extent of breast disease given diagnosis of  high grade DCIS and strong family history of breast cancer.   Pathology results reported by Randa Lynn RN on 03/16/2023.   Electronically Signed  By: Meda Klinefelter M.D.  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 The risks of general anesthetic, if used, includes MI, CVA, sudden death or even reaction to anesthetic medications also discussed.  Typical post-op recovery time and possbility of activity restrictions were also discussed. Alternatives include continued observation. Benefits include possible symptom relief, pathologic evaluation, and/or curative excision.   The patient verbalized understanding and all questions were answered to the patient's satisfaction.  2. Patient has elected to proceed with surgical treatment. Procedure will be scheduled. Right lumpectomy after SCOUT tag placement, SLNB if MRI and genetic testing negative. Will discuss additional options if either come back positive.  labs/images/medications/previous chart entries reviewed personally and relevant changes/updates noted above.

## 2023-04-07 ENCOUNTER — Encounter
Admission: RE | Admit: 2023-04-07 | Discharge: 2023-04-07 | Disposition: A | Discharge status: Home / Self Care | Payer: Commercial Managed Care - PPO | Source: Ambulatory Visit | Attending: Surgery | Admitting: Surgery

## 2023-04-07 ENCOUNTER — Other Ambulatory Visit: Payer: Self-pay

## 2023-04-07 VITALS — Ht 59.0 in | Wt 162.0 lb

## 2023-04-07 DIAGNOSIS — D7589 Other specified diseases of blood and blood-forming organs: Secondary | ICD-10-CM

## 2023-04-07 DIAGNOSIS — D0511 Intraductal carcinoma in situ of right breast: Secondary | ICD-10-CM

## 2023-04-07 DIAGNOSIS — Z01812 Encounter for preprocedural laboratory examination: Secondary | ICD-10-CM

## 2023-04-07 HISTORY — DX: Other specified diseases of blood and blood-forming organs: D75.89

## 2023-04-07 HISTORY — DX: Intraductal carcinoma in situ of right breast: D05.11

## 2023-04-07 HISTORY — DX: Obstructive sleep apnea (adult) (pediatric): G47.33

## 2023-04-07 NOTE — Patient Instructions (Addendum)
 Your procedure is scheduled on:04-15-23 Friday Report to the Registration Desk on the 1st floor of the Medical Mall.Then proceed to the Radiology Desk (2nd desk on right). Arrive at 8:30 am  REMEMBER: Instructions that are not followed completely may result in serious medical risk, up to and including death; or upon the discretion of your surgeon and anesthesiologist your surgery may need to be rescheduled.  Do not eat food after midnight the night before surgery.  No gum chewing or hard candies.  You may however, drink CLEAR liquids up to 2 hours before you are scheduled to arrive for your surgery. Do not drink anything within 2 hours of your scheduled arrival time.  Clear liquids include: - water  - apple juice without pulp - gatorade (not RED colors) - black coffee or tea (Do NOT add milk or creamers to the coffee or tea) Do NOT drink anything that is not on this list.  One week prior to surgery:Stop NOW (04-07-23) Stop Anti-inflammatories (NSAIDS) such as Advil, Aleve, Ibuprofen, Motrin, Naproxen, Naprosyn and Aspirin based products such as Excedrin, Goody's Powder, BC Powder. Stop ANY OVER THE COUNTER supplements until after surgery.  You may however, continue to take Tylenol if needed for pain up until the day of surgery  Continue taking all of your other prescription medications up until the day of surgery.  ON THE DAY OF SURGERY ONLY TAKE THESE MEDICATIONS WITH SIPS OF WATER: -cetirizine (ZYRTEC)  -escitalopram (LEXAPRO)   No Alcohol for 24 hours before or after surgery.  No Smoking including e-cigarettes for 24 hours before surgery.  No chewable tobacco products for at least 6 hours before surgery.  No nicotine patches on the day of surgery.  Do not use any "recreational" drugs for at least a week (preferably 2 weeks) before your surgery.  Please be advised that the combination of cocaine and anesthesia may have negative outcomes, up to and including death. If you test  positive for cocaine, your surgery will be cancelled.  On the morning of surgery brush your teeth with toothpaste and water, you may rinse your mouth with mouthwash if you wish. Do not swallow any toothpaste or mouthwash.  Use CHG Soap as directed on instruction sheet.  Bring your C-Pap machine to the hospital  Do not wear jewelry, make-up, hairpins, clips or nail polish.  For welded (permanent) jewelry: bracelets, anklets, waist bands, etc.  Please have this removed prior to surgery.  If it is not removed, there is a chance that hospital personnel will need to cut it off on the day of surgery.  Do not wear lotions, powders, or perfumes.   Do not shave body hair from the neck down 48 hours before surgery.  Contact lenses, hearing aids and dentures may not be worn into surgery.  Do not bring valuables to the hospital. [facility] is not responsible for any missing/lost belongings or valuables.   Notify your doctor if there is any change in your medical condition (cold, fever, infection).  Wear comfortable clothing (specific to your surgery type) to the hospital.  After surgery, you can help prevent lung complications by doing breathing exercises.  Take deep breaths and cough every 1-2 hours. Your doctor may order a device called an Incentive Spirometer to help you take deep breaths. When coughing or sneezing, hold a pillow firmly against your incision with both hands. This is called "splinting." Doing this helps protect your incision. It also decreases belly discomfort.  If you are being admitted  to the hospital overnight, leave your suitcase in the car. After surgery it may be brought to your room.  In case of increased patient census, it may be necessary for you, the patient, to continue your postoperative care in the Same Day Surgery department.  If you are being discharged the day of surgery, you will not be allowed to drive home. You will need a responsible individual to drive  you home and stay with you for 24 hours after surgery.   If you are taking public transportation, you will need to have a responsible individual with you.  Please call the Pre-admissions Testing Dept. at [phone] if you have any questions about these instructions.  Surgery Visitation Policy:  Patients having surgery or a procedure may have two visitors.  Children under the age of 74 must have an adult with them who is not the patient.  Temporary Visitor Restrictions Due to increasing cases of flu, RSV and COVID-19: Children ages 23 and under will not be able to visit patients in [facility] hospitals under most circumstances.     Preparing for Surgery with CHLORHEXIDINE GLUCONATE (CHG) Soap  Chlorhexidine Gluconate (CHG) Soap  o An antiseptic cleaner that kills germs and bonds with the skin to continue killing germs even after washing  o Used for showering the night before surgery and morning of surgery  Before surgery, you can play an important role by reducing the number of germs on your skin.  CHG (Chlorhexidine gluconate) soap is an antiseptic cleanser which kills germs and bonds with the skin to continue killing germs even after washing.  Please do not use if you have an allergy to CHG or antibacterial soaps. If your skin becomes reddened/irritated stop using the CHG.  1. Shower the NIGHT BEFORE SURGERY and the MORNING OF SURGERY with CHG soap.  2. If you choose to wash your hair, wash your hair first as usual with your normal shampoo.  3. After shampooing, rinse your hair and body thoroughly to remove the shampoo.  4. Use CHG as you would any other liquid soap. You can apply CHG directly to the skin and wash gently with a scrungie or a clean washcloth.  5. Apply the CHG soap to your body only from the neck down. Do not use on open wounds or open sores. Avoid contact with your eyes, ears, mouth, and genitals (private parts). Wash face and genitals (private parts)  with your normal soap.  6. Wash thoroughly, paying special attention to the area where your surgery will be performed.  7. Thoroughly rinse your body with warm water.  8. Do not shower/wash with your normal soap after using and rinsing off the CHG soap.  9. Pat yourself dry with a clean towel.  10. Wear clean pajamas to bed the night before surgery.  12. Place clean sheets on your bed the night of your first shower and do not sleep with pets.  13. Shower again with the CHG soap on the day of surgery prior to arriving at the hospital.  14. Do not apply any deodorants/lotions/powders.  15. Please wear clean clothes to the hospital.

## 2023-04-08 ENCOUNTER — Encounter
Admission: RE | Admit: 2023-04-08 | Discharge: 2023-04-08 | Disposition: A | Discharge status: Home / Self Care | Source: Ambulatory Visit | Attending: Surgery | Admitting: Surgery

## 2023-04-08 DIAGNOSIS — D0511 Intraductal carcinoma in situ of right breast: Secondary | ICD-10-CM | POA: Diagnosis not present

## 2023-04-08 DIAGNOSIS — D7589 Other specified diseases of blood and blood-forming organs: Secondary | ICD-10-CM | POA: Diagnosis not present

## 2023-04-08 DIAGNOSIS — Z01812 Encounter for preprocedural laboratory examination: Secondary | ICD-10-CM | POA: Insufficient documentation

## 2023-04-08 LAB — CBC
HCT: 42.4 % (ref 36.0–46.0)
Hemoglobin: 14.6 g/dL (ref 12.0–15.0)
MCH: 34.6 pg — ABNORMAL HIGH (ref 26.0–34.0)
MCHC: 34.4 g/dL (ref 30.0–36.0)
MCV: 100.5 fL — ABNORMAL HIGH (ref 80.0–100.0)
Platelets: 291 10*3/uL (ref 150–400)
RBC: 4.22 MIL/uL (ref 3.87–5.11)
RDW: 13.4 % (ref 11.5–15.5)
WBC: 7.5 10*3/uL (ref 4.0–10.5)
nRBC: 0 % (ref 0.0–0.2)

## 2023-04-08 LAB — BASIC METABOLIC PANEL
Anion gap: 9 (ref 5–15)
BUN: 12 mg/dL (ref 6–20)
CO2: 27 mmol/L (ref 22–32)
Calcium: 9.5 mg/dL (ref 8.9–10.3)
Chloride: 102 mmol/L (ref 98–111)
Creatinine, Ser: 0.68 mg/dL (ref 0.44–1.00)
GFR, Estimated: >60 mL/min (ref >60)
Glucose, Bld: 94 mg/dL (ref 70–99)
Potassium: 3.8 mmol/L (ref 3.5–5.1)
Sodium: 138 mmol/L (ref 135–145)

## 2023-04-14 MED ORDER — CHLORHEXIDINE GLUCONATE CLOTH 2 % EX PADS
6.0000 | MEDICATED_PAD | Freq: Once | CUTANEOUS | Status: AC
  Administered 2023-04-15: 6 via TOPICAL

## 2023-04-14 MED ORDER — CEFAZOLIN SODIUM-DEXTROSE 2-4 GM/100ML-% IV SOLN
2.0000 g | INTRAVENOUS | Status: AC
  Administered 2023-04-15: 2 g via INTRAVENOUS

## 2023-04-14 MED ORDER — LACTATED RINGERS IV SOLN: INTRAVENOUS | Status: DC

## 2023-04-14 MED ORDER — CHLORHEXIDINE GLUCONATE 0.12 % MT SOLN
15.0000 mL | Freq: Once | OROMUCOSAL | Status: AC
  Administered 2023-04-15: 15 mL via OROMUCOSAL

## 2023-04-14 MED ORDER — ORAL CARE MOUTH RINSE: 15.0000 mL | Freq: Once | OROMUCOSAL | Status: AC

## 2023-04-15 ENCOUNTER — Encounter: Payer: Self-pay | Admitting: Surgery

## 2023-04-15 ENCOUNTER — Encounter
Admission: RE | Admit: 2023-04-15 | Discharge: 2023-04-15 | Disposition: A | Discharge status: Home / Self Care | Payer: Commercial Managed Care - PPO | Source: Ambulatory Visit | Attending: Surgery | Admitting: Surgery

## 2023-04-15 ENCOUNTER — Ambulatory Visit: Payer: Self-pay | Admitting: Urgent Care

## 2023-04-15 ENCOUNTER — Ambulatory Visit
Admission: RE | Admit: 2023-04-15 | Discharge: 2023-04-15 | Disposition: A | Discharge status: Home / Self Care | Payer: Commercial Managed Care - PPO | Source: Ambulatory Visit | Attending: Surgery | Admitting: Surgery

## 2023-04-15 ENCOUNTER — Ambulatory Visit: Admitting: Anesthesiology

## 2023-04-15 ENCOUNTER — Other Ambulatory Visit: Payer: Self-pay | Admitting: Pathology

## 2023-04-15 ENCOUNTER — Encounter
Admission: RE | Disposition: A | Discharge status: Home / Self Care | Payer: Self-pay | Source: Ambulatory Visit | Attending: Surgery

## 2023-04-15 ENCOUNTER — Other Ambulatory Visit: Payer: Self-pay

## 2023-04-15 ENCOUNTER — Ambulatory Visit
Admission: RE | Admit: 2023-04-15 | Discharge: 2023-04-15 | Disposition: A | Discharge status: Home / Self Care | Source: Ambulatory Visit | Attending: Pathology | Admitting: Pathology

## 2023-04-15 DIAGNOSIS — Z803 Family history of malignant neoplasm of breast: Secondary | ICD-10-CM | POA: Diagnosis not present

## 2023-04-15 DIAGNOSIS — G4730 Sleep apnea, unspecified: Secondary | ICD-10-CM | POA: Diagnosis not present

## 2023-04-15 DIAGNOSIS — Z87891 Personal history of nicotine dependence: Secondary | ICD-10-CM | POA: Insufficient documentation

## 2023-04-15 DIAGNOSIS — Z90710 Acquired absence of both cervix and uterus: Secondary | ICD-10-CM | POA: Diagnosis not present

## 2023-04-15 DIAGNOSIS — D0511 Intraductal carcinoma in situ of right breast: Secondary | ICD-10-CM

## 2023-04-15 DIAGNOSIS — M795 Residual foreign body in soft tissue: Secondary | ICD-10-CM

## 2023-04-15 DIAGNOSIS — J449 Chronic obstructive pulmonary disease, unspecified: Secondary | ICD-10-CM | POA: Insufficient documentation

## 2023-04-15 HISTORY — PX: PART MASTECTOMY,RADIO FREQUENCY LOCALIZER,AXILLARY SENTINEL NODE BIOPSY: SHX6901

## 2023-04-15 SURGERY — PART MASTECTOMY,RADIO FREQUENCY LOCALIZER,AXILLARY SENTINEL NODE BIOPSY
Anesthesia: General | Site: Breast | Laterality: Right

## 2023-04-15 MED ORDER — CELECOXIB 200 MG PO CAPS: 200.0000 mg | ORAL_CAPSULE | Freq: Two times a day (BID) | ORAL | 0 refills | Status: AC

## 2023-04-15 MED ORDER — ACETAMINOPHEN 10 MG/ML IV SOLN
INTRAVENOUS | Status: AC
  Filled 2023-04-15: qty 100

## 2023-04-15 MED ORDER — LABETALOL HCL 5 MG/ML IV SOLN
INTRAVENOUS | Status: DC | PRN
  Administered 2023-04-15 (×2): 5 mg via INTRAVENOUS

## 2023-04-15 MED ORDER — DEXAMETHASONE SODIUM PHOSPHATE 10 MG/ML IJ SOLN
INTRAMUSCULAR | Status: DC | PRN
  Administered 2023-04-15: 8 mg via INTRAVENOUS

## 2023-04-15 MED ORDER — PROPOFOL 10 MG/ML IV BOLUS
INTRAVENOUS | Status: DC | PRN
  Administered 2023-04-15: 150 mg via INTRAVENOUS
  Administered 2023-04-15: 100 ug/kg/min via INTRAVENOUS

## 2023-04-15 MED ORDER — CHLORHEXIDINE GLUCONATE 0.12 % MT SOLN
OROMUCOSAL | Status: AC
  Filled 2023-04-15: qty 15

## 2023-04-15 MED ORDER — FENTANYL CITRATE (PF) 100 MCG/2ML IJ SOLN
INTRAMUSCULAR | Status: AC
  Filled 2023-04-15: qty 2

## 2023-04-15 MED ORDER — FENTANYL CITRATE (PF) 100 MCG/2ML IJ SOLN: 25.0000 ug | INTRAMUSCULAR | Status: DC | PRN

## 2023-04-15 MED ORDER — LIDOCAINE HCL (PF) 2 % IJ SOLN
INTRAMUSCULAR | Status: AC
  Filled 2023-04-15: qty 5

## 2023-04-15 MED ORDER — GLYCOPYRROLATE 0.2 MG/ML IJ SOLN
INTRAMUSCULAR | Status: DC | PRN
  Administered 2023-04-15: .2 mg via INTRAVENOUS

## 2023-04-15 MED ORDER — ONDANSETRON HCL 4 MG/2ML IJ SOLN
INTRAMUSCULAR | Status: AC
  Filled 2023-04-15: qty 2

## 2023-04-15 MED ORDER — ACETAMINOPHEN 10 MG/ML IV SOLN
INTRAVENOUS | Status: DC | PRN
  Administered 2023-04-15: 1000 mg via INTRAVENOUS

## 2023-04-15 MED ORDER — PROPOFOL 1000 MG/100ML IV EMUL
INTRAVENOUS | Status: AC
  Filled 2023-04-15: qty 100

## 2023-04-15 MED ORDER — TECHNETIUM TC 99M TILMANOCEPT KIT
1.1800 | PACK | Freq: Once | INTRAVENOUS | Status: AC | PRN
  Administered 2023-04-15: 1.18 via INTRADERMAL

## 2023-04-15 MED ORDER — ONDANSETRON HCL 4 MG/2ML IJ SOLN
INTRAMUSCULAR | Status: DC | PRN
  Administered 2023-04-15: 4 mg via INTRAVENOUS

## 2023-04-15 MED ORDER — LABETALOL HCL 5 MG/ML IV SOLN
INTRAVENOUS | Status: AC
  Filled 2023-04-15: qty 4

## 2023-04-15 MED ORDER — LIDOCAINE HCL (CARDIAC) PF 100 MG/5ML IV SOSY
PREFILLED_SYRINGE | INTRAVENOUS | Status: DC | PRN
  Administered 2023-04-15: 100 mg via INTRAVENOUS

## 2023-04-15 MED ORDER — KETAMINE HCL 50 MG/5ML IJ SOSY
PREFILLED_SYRINGE | INTRAMUSCULAR | Status: AC
  Filled 2023-04-15: qty 5

## 2023-04-15 MED ORDER — PROPOFOL 10 MG/ML IV BOLUS
INTRAVENOUS | Status: AC
  Filled 2023-04-15: qty 20

## 2023-04-15 MED ORDER — MIDAZOLAM HCL 2 MG/2ML IJ SOLN
INTRAMUSCULAR | Status: AC
  Filled 2023-04-15: qty 2

## 2023-04-15 MED ORDER — BUPIVACAINE-EPINEPHRINE 0.5% -1:200000 IJ SOLN: INTRAMUSCULAR | Status: DC | PRN

## 2023-04-15 MED ORDER — KETAMINE HCL 50 MG/5ML IJ SOSY
PREFILLED_SYRINGE | INTRAMUSCULAR | Status: DC | PRN
  Administered 2023-04-15: 20 mg via INTRAVENOUS

## 2023-04-15 MED ORDER — BUPIVACAINE-EPINEPHRINE (PF) 0.5% -1:200000 IJ SOLN
INTRAMUSCULAR | Status: AC
  Filled 2023-04-15: qty 30

## 2023-04-15 MED ORDER — STERILE WATER FOR IRRIGATION IR SOLN
Status: DC | PRN
  Administered 2023-04-15: 1

## 2023-04-15 MED ORDER — ONDANSETRON HCL 4 MG/2ML IJ SOLN: 4.0000 mg | Freq: Once | INTRAMUSCULAR | Status: DC | PRN

## 2023-04-15 MED ORDER — LIDOCAINE HCL (PF) 1 % IJ SOLN
INTRAMUSCULAR | Status: DC | PRN
  Administered 2023-04-15 (×2): 15 mL

## 2023-04-15 MED ORDER — KETOROLAC TROMETHAMINE 30 MG/ML IJ SOLN
INTRAMUSCULAR | Status: AC
  Filled 2023-04-15: qty 1

## 2023-04-15 MED ORDER — FENTANYL CITRATE (PF) 100 MCG/2ML IJ SOLN: INTRAMUSCULAR | Status: DC | PRN

## 2023-04-15 MED ORDER — OXYCODONE HCL 5 MG PO TABS: 5.0000 mg | ORAL_TABLET | Freq: Once | ORAL | Status: DC | PRN

## 2023-04-15 MED ORDER — OXYCODONE HCL 5 MG/5ML PO SOLN: 5.0000 mg | Freq: Once | ORAL | Status: DC | PRN

## 2023-04-15 MED ORDER — MIDAZOLAM HCL 2 MG/2ML IJ SOLN
INTRAMUSCULAR | Status: DC | PRN
  Administered 2023-04-15: 2 mg via INTRAVENOUS

## 2023-04-15 MED ORDER — KETOROLAC TROMETHAMINE 30 MG/ML IJ SOLN
INTRAMUSCULAR | Status: DC | PRN
  Administered 2023-04-15: 30 mg via INTRAVENOUS

## 2023-04-15 MED ORDER — CEFAZOLIN SODIUM-DEXTROSE 2-4 GM/100ML-% IV SOLN
INTRAVENOUS | Status: AC
  Filled 2023-04-15: qty 100

## 2023-04-15 MED ORDER — LIDOCAINE HCL (PF) 1 % IJ SOLN
INTRAMUSCULAR | Status: AC
  Filled 2023-04-15: qty 30

## 2023-04-15 MED ORDER — DEXAMETHASONE SODIUM PHOSPHATE 10 MG/ML IJ SOLN
INTRAMUSCULAR | Status: AC
  Filled 2023-04-15: qty 1

## 2023-04-15 MED ORDER — LIDOCAINE HCL 1 % IJ SOLN: INTRAMUSCULAR | Status: DC | PRN

## 2023-04-15 SURGICAL SUPPLY — 34 items
BLADE PHOTON ILLUMINATED (MISCELLANEOUS) ×1 IMPLANT
BLADE SURG 15 STRL LF DISP TIS (BLADE) ×1 IMPLANT
CHLORAPREP W/TINT 26 (MISCELLANEOUS) IMPLANT
COVER PROBE GAMMA FINDER SLV (MISCELLANEOUS) IMPLANT
DERMABOND ADVANCED .7 DNX12 (GAUZE/BANDAGES/DRESSINGS) ×1 IMPLANT
DEVICE DUBIN SPECIMEN MAMMOGRA (MISCELLANEOUS) ×1 IMPLANT
DRAPE LAPAROTOMY 77X122 PED (DRAPES) ×1 IMPLANT
ELECT REM PT RETURN 9FT ADLT (ELECTROSURGICAL) ×1 IMPLANT
ELECTRODE REM PT RTRN 9FT ADLT (ELECTROSURGICAL) ×1 IMPLANT
GAUZE 4X4 16PLY ~~LOC~~+RFID DBL (SPONGE) IMPLANT
GLOVE BIOGEL PI IND STRL 7.0 (GLOVE) ×1 IMPLANT
GLOVE SURG SYN 6.5 ES PF (GLOVE) ×3 IMPLANT
GLOVE SURG SYN 6.5 PF PI (GLOVE) ×1 IMPLANT
GOWN STRL REUS W/ TWL LRG LVL3 (GOWN DISPOSABLE) ×3 IMPLANT
KIT MARKER MARGIN INK (KITS) ×1 IMPLANT
KIT TURNOVER KIT A (KITS) ×1 IMPLANT
LABEL OR SOLS (LABEL) ×1 IMPLANT
LIGHT WAVEGUIDE WIDE FLAT (MISCELLANEOUS) IMPLANT
MANIFOLD NEPTUNE II (INSTRUMENTS) ×1 IMPLANT
MARKER MARGIN CORRECT CLIP (MARKER) ×1 IMPLANT
NDL HYPO 22X1.5 SAFETY MO (MISCELLANEOUS) ×1 IMPLANT
NEEDLE HYPO 22X1.5 SAFETY MO (MISCELLANEOUS) ×1 IMPLANT
PACK BASIN MINOR ARMC (MISCELLANEOUS) ×1 IMPLANT
SHEATH BREAST BIOPSY SKIN MKR (SHEATH) ×1 IMPLANT
SUT MNCRL 4-0 27 PS-2 XMFL (SUTURE) ×2 IMPLANT
SUT SILK 3 0 12 30 (SUTURE) IMPLANT
SUT VIC AB 3-0 SH 27X BRD (SUTURE) ×1 IMPLANT
SUTURE MNCRL 4-0 27XMF (SUTURE) ×1 IMPLANT
SYR 20ML LL LF (SYRINGE) ×1 IMPLANT
SYR BULB IRRIG 60ML STRL (SYRINGE) ×1 IMPLANT
TRAP FLUID SMOKE EVACUATOR (MISCELLANEOUS) ×1 IMPLANT
TRAP NEPTUNE SPECIMEN COLLECT (MISCELLANEOUS) ×1 IMPLANT
WATER STERILE IRR 1000ML POUR (IV SOLUTION) ×1 IMPLANT
WATER STERILE IRR 500ML POUR (IV SOLUTION) ×1 IMPLANT

## 2023-04-15 NOTE — Interval H&P Note (Signed)
 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  The various methods of treatment have been discussed with the patient and family. After consideration of risks, benefits and other options for treatment, the patient has consented to  Procedure(s): PART MASTECTOMY,RADIO FREQUENCY LOCALIZER,AXILLARY SENTINEL NODE BIOPSY (Right) as a surgical intervention.  The patient's history has been reviewed, patient examined, no change in status, stable for surgery.  I have reviewed the patient's chart and labs.  Questions were answered to the patient's satisfaction.     [name] Tonna Boehringer

## 2023-04-15 NOTE — Transfer of Care (Signed)
 Immediate Anesthesia Transfer of Care Note  Patient: Wendy Russell  Procedure(s) Performed: PART MASTECTOMY,RADIO FREQUENCY LOCALIZER,AXILLARY SENTINEL NODE BIOPSY (Right: Breast)  Patient Location: PACU  Anesthesia Type:General  Level of Consciousness: awake, alert , oriented, and patient cooperative  Airway & Oxygen Therapy: Patient Spontanous Breathing and Patient connected to face mask oxygen  Post-op Assessment: Report given to RN, Post -op Vital signs reviewed and stable, and Patient moving all extremities  Post vital signs: Reviewed and stable  Last Vitals:  Vitals Value Taken Time  BP 149/81 04/15/23 1025  Temp 36 C 04/15/23 1025  Pulse 75 04/15/23 1027  Resp 17 04/15/23 1027  SpO2 100 % 04/15/23 1027  Vitals shown include unfiled device data.  Last Pain:  Vitals:   04/15/23 1025  TempSrc:   PainSc: 0-No pain         Complications: No notable events documented.

## 2023-04-15 NOTE — Op Note (Signed)
 Preoperative diagnosis: Right breast carcinoma.  Postoperative diagnosis: Same.   Procedure: SCOUT tag-localized right breast partial mastectomy.                      Right axillary Sentinel Lymph node biopsy  Anesthesia: GETA  Surgeon: Dr. Sung Amabile  Wound Classification: Clean  Indications: Patient is a 53 y.o. female with a nonpalpable right breast mass noted on mammography with core biopsy demonstrating DCIS.  Requires SCOUT localizer placement, partial mastectomy for treatment with sentinel lymph node biopsy.   Specimen: Right breast mass, Sentinel Lymph nodes x 2  Complications: None  Estimated Blood Loss: 30 mL  Findings: 1. Specimen mammography shows marker and SCOUT localizer on specimen 2. Pathology call refers gross examination of margins was negative 3. No other palpable mass or lymph node identified.   Operation performed with curative intent:Yes  Tracer(s) used to identify sentinel nodes in the upfront surgery (non-neoadjuvant) setting (select all that apply):Radioactive Tracer  Tracer(s) used to identify sentinel nodes in the neoadjuvant setting (select all that apply):N/A  All nodes (colored or non-colored) present at the end of a dye-filled lymphatic channel were removed:N/A  All significantly radioactive nodes were removed:Yes  All palpable suspicious nodes were removed:N/A  Biopsy-proven positive nodes marked with clips prior to chemotherapy were identified and removed:N/A    Description of procedure: SCOUT localization was performed by radiology prior to procedure. In the nuclear medicine suite, the subareolar region was injected with Tc-99 sulfur colloid the morning of procedure. Localization studies were reviewed. The patient was taken to the operating room and placed supine on the operating table, and after general anesthesia the right breast and axilla were prepped and draped in the usual sterile fashion. A time-out was completed verifying correct  patient, procedure, site, positioning, and implant(s) and/or special equipment prior to beginning this procedure.  By identifying the SCOUT localizer, the probable trajectory and location of the mass was visualized. A skin incision was planned in such a way as to minimize the amount of dissection to reach the mass.  The skin incision was made after infusion of local. Flaps were raised and  Sharp and blunt dissection was then taken down to the mass, taking care to include the entire SCOUT localizer and a margin of grossly normal tissue. The specimen was removed. The specimen was oriented with paint. Imaging reviewed and the entire target lesion had been resected, with biopsy clip and localizer within the specimen.Gross margin analysis by pathology confirmed all margins cleared on initial inspection. A hand-held gamma probe was used to identify the location of the hottest spot in the axilla. An incision was made around the caudal axillary hairline. Sharp and blunt Dissection was carried down to subdermal facias. The probe was placed within wound and again, the point of maximal count was found. Dissection continue until nodule was identified. The probe was placed in contact with the node and 600 counts were recorded. The node was excised in its entirety. The bed of the node measured less than 100 counts. An additional hot spot was detected and the node was excised in similar fashion. No additional hot spots were identified. No clinically abnormal nodes were palpated. Both wounds irrigated, hemostasis was achieved and the wound closed in layers with  interrupted sutures of 3-0 Vicryl in deep dermal layer and a running subcuticular suture of Monocryl 4-0, then dressed with dermabond. The patient tolerated the procedure well and was taken to the postanesthesia care unit in  stable condition. Sponge and instrument count correct at end of procedure.

## 2023-04-15 NOTE — Discharge Instructions (Signed)
 Removal, Care After This sheet gives you information about how to care for yourself after your procedure. Your health care provider may also give you more specific instructions. If you have problems or questions, contact your health care provider. What can I expect after the procedure? After the procedure, it is common to have: Soreness. Bruising. Itching. Follow these instructions at home: site care Follow instructions from your health care provider about how to take care of your site. Make sure you: Wash your hands with soap and water before and after you change your bandage (dressing). If soap and water are not available, use hand sanitizer. Leave stitches (sutures), skin glue, or adhesive strips in place. These skin closures may need to stay in place for 2 weeks or longer. If adhesive strip edges start to loosen and curl up, you may trim the loose edges. Do not remove adhesive strips completely unless your health care provider tells you to do that. If the area bleeds or bruises, apply gentle pressure for 10 minutes. OK TO SHOWER IN 24HRS  Check your site every day for signs of infection. Check for: Redness, swelling, or pain. Fluid or blood. Warmth. Pus or a bad smell.  General instructions Rest and then return to your normal activities as told by your health care provider.  tylenol and Celebrex as needed for discomfort.     325-650mg  every 8hrs to max of 3000mg /24hrs for the tylenol.   Keep all follow-up visits as told by your health care provider. This is important. Contact a health care provider if: You have redness, swelling, or pain around your site. You have fluid or blood coming from your site. Your site feels warm to the touch. You have pus or a bad smell coming from your site. You have a fever. Your sutures, skin glue, or adhesive strips loosen or come off sooner than expected. Get help right away if: You have bleeding that does not stop with pressure or a  dressing. Summary After the procedure, it is common to have some soreness, bruising, and itching at the site. Follow instructions from your health care provider about how to take care of your site. Check your site every day for signs of infection. Contact a health care provider if you have redness, swelling, or pain around your site, or your site feels warm to the touch. Keep all follow-up visits as told by your health care provider. This is important. This information is not intended to replace advice given to you by your health care provider. Make sure you discuss any questions you have with your health care provider. Document Released: 02/14/2015 Document Revised: 07/18/2017 Document Reviewed: 07/18/2017 Elsevier Interactive Patient Education  [employer].

## 2023-04-15 NOTE — Anesthesia Preprocedure Evaluation (Addendum)
 Anesthesia Evaluation  Patient identified by MRN, date of birth, ID band Patient awake    Reviewed: Allergy & Precautions, NPO status , Patient's Chart, lab work & pertinent test results  Airway Mallampati: III  TM Distance: >3 FB Neck ROM: full    Dental  (+) Teeth Intact   Pulmonary neg pulmonary ROS, sleep apnea , COPD, Patient abstained from smoking., former smoker   Pulmonary exam normal  + decreased breath sounds      Cardiovascular Exercise Tolerance: Good negative cardio ROS Normal cardiovascular exam Rhythm:Regular Rate:Normal     Neuro/Psych  Headaches  Anxiety     negative neurological ROS  negative psych ROS   GI/Hepatic negative GI ROS, Neg liver ROS,GERD  Medicated,,  Endo/Other  negative endocrine ROS    Renal/GU negative Renal ROS  negative genitourinary   Musculoskeletal  (+) Arthritis ,    Abdominal Normal abdominal exam  (+)   Peds negative pediatric ROS (+)  Hematology negative hematology ROS (+) Blood dyscrasia, anemia   Anesthesia Other Findings Past Medical History: No date: Anemia     Comment:  H/O No date: Anxiety No date: Arthritis     Comment:  HANDS No date: Claustrophobia 10/2019: COVID     Comment:  monocloncal atb and got it 10/09/2019 No date: Dizziness No date: Ductal carcinoma in situ (DCIS) of right breast No date: Family history of adverse reaction to anesthesia     Comment:  mom-delayed emergence    sister-unsure of reaction No date: GERD (gastroesophageal reflux disease)     Comment:  OCC-TUMS PRN No date: Macrocytosis 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 No date: Migraines No date: OSA on CPAP  Past Surgical History: 03/14/2023: BREAST BIOPSY; Right     Comment:  Stereo bx, x-clip, DUCTAL CARCINOMA IN SITU, HIGH GRADE, 03/14/2023: BREAST BIOPSY; Right     Comment:  MM RT BREAST BX W LOC DEV 1ST LESION IMAGE BX SPEC               STEREO GUIDE 03/14/2023 [facility] 04/05/2023: BREAST  BIOPSY; Right     Comment:  MM RT RADIO FREQUENCY TAG LOC MAMMO GUIDE 04/05/2023               [facility] No date: CESAREAN SECTION     Comment:  X2 07/08/2017: DILITATION & CURRETTAGE/HYSTROSCOPY WITH NOVASURE  ABLATION; N/A     Comment:  Procedure: DILATATION & CURETTAGE/HYSTEROSCOPY WITH               NOVASURE ABLATION;  Surgeon: Schermerhorn, Ihor Austin, MD;               Location: [facility];  Service: Gynecology;  Laterality:               N/A; 09/22/2018: HEMORRHOID SURGERY; N/A     Comment:  Procedure: OPEN HEMORRHOIDECTOMY;  Surgeon: Sung Amabile, DO;  Location: [facility];  Service: General;                Laterality: N/A; 11/22/2017: LAPAROSCOPIC BILATERAL SALPINGECTOMY; Bilateral     Comment:  Procedure: LAPAROSCOPIC BILATERAL SALPINGECTOMY;                Surgeon: Suzy Bouchard, MD;  Location: [facility];              Service: Gynecology;  Laterality: Bilateral; 11/22/2017: LAPAROSCOPIC SUPRACERVICAL HYSTERECTOMY; N/A     Comment:  Procedure: LAPAROSCOPIC SUPRACERVICAL  HYSTERECTOMY;                Surgeon: Schermerhorn, Ihor Austin, MD;  Location: [facility];              Service: Gynecology;  Laterality: N/A; 1993: TUBAL LIGATION No date: WISDOM TOOTH EXTRACTION  BMI    Body Mass Index: 33.33 kg/m      Reproductive/Obstetrics negative OB ROS                             Anesthesia Physical Anesthesia Plan  ASA: 3  Anesthesia Plan: General   Post-op Pain Management:    Induction: Intravenous  PONV Risk Score and Plan: 1 and Dexamethasone, Ondansetron, Midazolam and Treatment may vary due to age or medical condition  Airway Management Planned: LMA  Additional Equipment:   Intra-op Plan:   Post-operative Plan: Extubation in OR  Informed Consent: I have reviewed the patients History and Physical, chart, labs and discussed the procedure including the risks, benefits and alternatives for the proposed anesthesia with  the patient or authorized representative who has indicated his/her understanding and acceptance.     Dental Advisory Given  Plan Discussed with: CRNA  Anesthesia Plan Comments:        Anesthesia Quick Evaluation

## 2023-04-15 NOTE — Anesthesia Procedure Notes (Signed)
 Procedure Name: LMA Insertion Date/Time: 04/15/2023 9:07 AM  Performed by: Rich Brave, CRNAPre-anesthesia Checklist: Patient identified, Emergency Drugs available, Suction available, Patient being monitored and Timeout performed Patient Re-evaluated:Patient Re-evaluated prior to induction Oxygen Delivery Method: Circle system utilized Induction Type: Combination inhalational/ intravenous induction Ventilation: Mask ventilation without difficulty LMA: LMA inserted LMA Size: 4.0 Number of attempts: 1 Placement Confirmation: ETT inserted through vocal cords under direct vision, positive ETCO2 and breath sounds checked- equal and bilateral Tube secured with: Tape Dental Injury: Teeth and Oropharynx as per pre-operative assessment

## 2023-04-15 NOTE — Anesthesia Postprocedure Evaluation (Signed)
 Anesthesia Post Note  Patient: Wendy Russell  Procedure(s) Performed: PART MASTECTOMY,RADIO FREQUENCY LOCALIZER,AXILLARY SENTINEL NODE BIOPSY (Right: Breast)  Patient location during evaluation: PACU Anesthesia Type: General Level of consciousness: awake Pain management: pain level controlled Vital Signs Assessment: post-procedure vital signs reviewed and stable Respiratory status: spontaneous breathing Cardiovascular status: blood pressure returned to baseline Anesthetic complications: no   No notable events documented.   Last Vitals:  Vitals:   04/15/23 1045 04/15/23 1100  BP: 136/80 (!) 147/75  Pulse: 64 (!) 56  Resp: 19 19  Temp:  (!) 36.1 C  SpO2: 96% 95%    Last Pain:  Vitals:   04/15/23 1100  TempSrc:   PainSc: 0-No pain                 VAN STAVEREN,Bren Steers

## 2023-04-16 ENCOUNTER — Encounter: Payer: Self-pay | Admitting: Surgery

## 2023-04-18 ENCOUNTER — Ambulatory Visit: Payer: Self-pay | Admitting: Genetic Counselor

## 2023-04-18 ENCOUNTER — Telehealth: Payer: Self-pay | Admitting: Genetic Counselor

## 2023-04-18 DIAGNOSIS — Z1379 Encounter for other screening for genetic and chromosomal anomalies: Secondary | ICD-10-CM | POA: Insufficient documentation

## 2023-04-18 NOTE — Telephone Encounter (Signed)
 I spoke to Wendy Russell to review results of genetic testing. she had genetic testing with [facility] CancerNext-Expanded +RNAinsight panel. Testing did not identify any variants known to increase the risk for cancer.  Discussed that we do not know why she has breast cancer or why there is cancer in the family. It could be due to a different gene that we are not testing, or maybe our current technology may not be able to pick something up.  It will be important for her to keep in contact with genetics to keep up with whether additional testing may be needed.  Please see counseling note for further detail on this result.

## 2023-04-18 NOTE — Progress Notes (Signed)
 HPI:  Wendy Russell was previously seen in the [facility] due to a personal and family history of cancer and concerns regarding a hereditary predisposition to cancer. Please refer to our prior cancer genetics clinic note for more information regarding our discussion, assessment and recommendations, at the time. Wendy Russell recent genetic test results were disclosed to her by phone on 04/18/23 as were recommendations warranted by these results. These results and recommendations are discussed in more detail below.  CANCER HISTORY:  Oncology History  Ductal carcinoma in situ (DCIS) of right breast  03/22/2023 Cancer Staging   Staging form: Breast, AJCC 8th Edition - Clinical stage from 03/22/2023: Stage 0 (cTis (DCIS), cN0(f), cM0, G3, ER-, PR-, HER2: Not Assessed) - Signed by Creig Hines, MD on 03/23/2023 Stage prefix: Initial diagnosis Method of lymph node assessment: Core biopsy Nuclear grade: G3 Histologic grading system: 3 grade system   03/23/2023 Initial Diagnosis   Ductal carcinoma in situ (DCIS) of right breast    Genetic Testing   Negative 76 gene [facility] CancerNext-Expanded +RNAinsight. The CancerNext-Expanded gene panel offered by [facility] and includes sequencing, rearrangement, and RNA analysis for the following 76 genes: AIP, ALK, APC, ATM, AXIN2, BAP1, BARD1, BMPR1A, BRCA1, BRCA2, BRIP1, CDC73, CDH1, CDK4, CDKN1B, CDKN2A, CEBPA, CHEK2, CTNNA1, DDX41, DICER1, ETV6, FH, FLCN, GATA2, LZTR1, MAX, MBD4, MEN1, MET, MLH1, MSH2, MSH3, MSH6, MUTYH, NF1, NF2, NTHL1, PALB2, PHOX2B, PMS2, POT1, PRKAR1A, PTCH1, PTEN, RAD51C, RAD51D, RB1, RET, RUNX1, SDHA, SDHAF2, SDHB, SDHC, SDHD, SMAD4, SMARCA4, SMARCB1, SMARCE1, STK11, SUFU, TMEM127, TP53, TSC1, TSC2, VHL, and WT1 (sequencing and deletion/duplication); EGFR, HOXB13, KIT, MITF, PDGFRA, POLD1, and POLE (sequencing only); EPCAM and GREM1 (deletion/duplication only). Report date 04/14/23.      FAMILY HISTORY:  We obtained a  detailed, 4-generation family history.  Significant diagnoses are listed below: Family History  Problem Relation Age of Onset   Breast cancer Mother 58      Wendy Russell is unaware of previous family history of genetic testing for hereditary cancer risks. There is no reported Ashkenazi Jewish ancestry. There is no known consanguinity.    Wendy Russell reports her mother was diagnosed with breast cancer at age 81, deceased. She reports her mother also had a hysterectomy, but is unsure if this was due to gynecologic cancer or due to other reasons. She reports a maternal first cousin once removed diagnosed with breast cancer. She reports three maternal great uncles diagnosed with lung cancer, one maternal great aunt with stomach cancer, and one maternal great aunt with metastatic cancer (primary unknown). She does not report any additional family history of tumors or cancer, but did share that she does not have any information on paternal health history.      GENETIC TEST RESULTS: Genetic testing reported out on 04/14/23 through the [facility] CancerNext-Expanded +RNAInsight panel found no pathogenic mutations. The CancerNext-Expanded gene panel offered by [facility] and includes sequencing, rearrangement, and RNA analysis for the following 76 genes: AIP, ALK, APC, ATM, AXIN2, BAP1, BARD1, BMPR1A, BRCA1, BRCA2, BRIP1, CDC73, CDH1, CDK4, CDKN1B, CDKN2A, CEBPA, CHEK2, CTNNA1, DDX41, DICER1, ETV6, FH, FLCN, GATA2, LZTR1, MAX, MBD4, MEN1, MET, MLH1, MSH2, MSH3, MSH6, MUTYH, NF1, NF2, NTHL1, PALB2, PHOX2B, PMS2, POT1, PRKAR1A, PTCH1, PTEN, RAD51C, RAD51D, RB1, RET, RUNX1, SDHA, SDHAF2, SDHB, SDHC, SDHD, SMAD4, SMARCA4, SMARCB1, SMARCE1, STK11, SUFU, TMEM127, TP53, TSC1, TSC2, VHL, and WT1 (sequencing and deletion/duplication); EGFR, HOXB13, KIT, MITF, PDGFRA, POLD1, and POLE (sequencing only); EPCAM and GREM1 (deletion/duplication only). The test report has  been scanned into EPIC and is located under the Molecular  Pathology section of the Results Review tab.  A portion of the result report is included below for reference.     We discussed with Wendy Russell that because current genetic testing is not perfect, it is possible there may be a gene mutation in one of these genes that current testing cannot detect, but that chance is small.  We also discussed, that there could be another gene that has not yet been discovered, or that we have not yet tested, that is responsible for the cancer diagnoses in the family. It is also possible there is a hereditary cause for the cancer in the family that Wendy Russell did not inherit and therefore was not identified in her testing.  Therefore, it is important to remain in touch with cancer genetics in the future so that we can continue to offer Wendy Russell the most up to date genetic testing.    ADDITIONAL GENETIC TESTING: We discussed with Wendy Russell that her genetic testing was fairly extensive.  If there are genes identified to increase cancer risk that can be analyzed in the future, we would be happy to discuss and coordinate this testing at that time.    CANCER SCREENING RECOMMENDATIONS: Wendy Russell test result is considered negative (normal).  This means that we have not identified a hereditary cause for her personal and family history of cancer at this time. Most cancers happen by chance and this negative test suggests that her personal and family history of cancer may fall into this category.    Possible reasons for Wendy Russell's negative genetic test include:  1. There may be a gene mutation in one of these genes that current testing methods cannot detect but that chance is small.  2. There could be another gene that has not yet been discovered, or that we have not yet tested, that is responsible for the cancer diagnoses in the family.  3.  There may be no hereditary risk for cancer in the family. The cancers in Wendy Russell and/or her family may be sporadic/familial or due  to other genetic and environmental factors. 4. It is also possible there is a hereditary cause for the cancer in the family that Wendy Russell did not inherit.  Therefore, it is recommended she continue to follow the cancer management and screening guidelines provided by her oncology and primary healthcare provider. An individual's cancer risk and medical management are not determined by genetic test results alone. Overall cancer risk assessment incorporates additional factors, including personal medical history, family history, and any available genetic information that may result in a personalized plan for cancer prevention and surveillance  Given Wendy Russell's personal and family histories, we must interpret these negative results with some caution.  Families with features suggestive of hereditary risk for cancer tend to have multiple family members with cancer, diagnoses in multiple generations and diagnoses before the age of 22. Wendy Russell family exhibits some of these features. Thus, this result may simply reflect our current inability to detect all mutations within these genes or there may be a different gene that has not yet been discovered or tested.   An individual's cancer risk and medical management are not determined by genetic test results alone. Overall cancer risk assessment incorporates additional factors, including personal medical history, family history, and any available genetic information that may result in a personalized plan for cancer prevention and surveillance.   RECOMMENDATIONS FOR FAMILY MEMBERS:  Individuals in this family might be at some increased risk of developing cancer, over the general population risk, simply due to the family history of cancer.  We recommended women in this family have a yearly mammogram beginning at age 59, or 35 years younger than the earliest onset of cancer, an annual clinical breast exam, and perform monthly breast self-exams. Women in this family  should also have a gynecological exam as recommended by their primary provider. All family members should be referred for colonoscopy starting at age 39, or 87 years younger than the earliest onset of cancer.  FOLLOW-UP: Lastly, we discussed with Wendy Russell that cancer genetics is a rapidly advancing field and it is possible that new genetic tests will be appropriate for her and/or her family members in the future. We encouraged her to remain in contact with cancer genetics on an annual basis so we can update her personal and family histories and let her know of advances in cancer genetics that may benefit this family.   Our contact number was provided. Wendy Russell questions were answered to her satisfaction, and she knows she is welcome to call us at anytime with additional questions or concerns.   Vassie Moment, MS, [facility] Licensed, Retail [email or web address]

## 2023-04-19 ENCOUNTER — Other Ambulatory Visit: Payer: Self-pay | Admitting: Pathology

## 2023-04-19 ENCOUNTER — Encounter: Payer: Self-pay | Admitting: *Deleted

## 2023-04-19 LAB — SURGICAL PATHOLOGY

## 2023-04-19 NOTE — Progress Notes (Signed)
 Faxed order and called [facility] to add ER/PR on surgical specimen from lumpectomy on 04/15/23.

## 2023-04-20 ENCOUNTER — Inpatient Hospital Stay: Payer: Commercial Managed Care - PPO | Attending: Oncology | Admitting: Hospice and Palliative Medicine

## 2023-04-20 DIAGNOSIS — D0511 Intraductal carcinoma in situ of right breast: Secondary | ICD-10-CM

## 2023-04-20 NOTE — Progress Notes (Signed)
 Multidisciplinary Oncology Council Documentation  Wendy Russell was presented by our [facility] on 04/20/2023, which included representatives from:  Palliative Care Dietitian  Physical/Occupational Therapist Nurse Navigator Genetics Social work Survivorship RN Financial Navigator Research RN   Wendy Russell currently presents with history of DCIS  We reviewed previous medical and familial history, history of present illness, and recent lab results along with all available histopathologic and imaging studies. The MOC considered available treatment options and made the following recommendations/referrals:  Rehab screening  The MOC is a meeting of clinicians from various specialty areas who evaluate and discuss patients for whom a multidisciplinary approach is being considered. Final determinations in the plan of care are those of the provider(s).   Today's extended care, comprehensive team conference, Wendy Russell was not present for the discussion and was not examined.

## 2023-04-25 ENCOUNTER — Other Ambulatory Visit

## 2023-04-25 NOTE — Progress Notes (Signed)
 Tumor Board Documentation  Wendy Russell was presented by Dr. Smith Robert at our Tumor Board on 04/25/2023, which included representatives from medical oncology, radiation oncology, surgical, radiology, pathology, navigation.  Wendy Russell currently presents as a current patient with history of the following treatments: surgical intervention(s).  Additionally, we reviewed previous medical and familial history, history of present illness, and recent lab results along with all available histopathologic and imaging studies. The tumor board considered available treatment options and made the following recommendations: Adjuvant radiation    The following procedures/referrals were also placed: No orders of the defined types were placed in this encounter.   Clinical Trial Status:     Staging used: AJCC Staging: T: Tis N: N0        National site-specific guidelines   were discussed with respect to the case.  Tumor board is a meeting of clinicians from various specialty areas who evaluate and discuss patients for whom a multidisciplinary approach is being considered. Final determinations in the plan of care are those of the provider(s). The responsibility for follow up of recommendations given during tumor board is that of the provider.   Today's extended care, comprehensive team conference, Wendy Russell was not present for the discussion and was not examined.   Multidisciplinary Tumor Board is a multidisciplinary case peer review process.  Decisions discussed in the Multidisciplinary Tumor Board reflect the opinions of the specialists present at the conference without having examined the patient.  Ultimately, treatment and diagnostic decisions rest with the primary provider(s) and the patient.

## 2023-05-03 ENCOUNTER — Encounter: Payer: Self-pay | Admitting: Oncology

## 2023-05-03 ENCOUNTER — Inpatient Hospital Stay (HOSPITAL_BASED_OUTPATIENT_CLINIC_OR_DEPARTMENT_OTHER): Payer: Commercial Managed Care - PPO | Admitting: Oncology

## 2023-05-03 ENCOUNTER — Other Ambulatory Visit: Payer: Self-pay

## 2023-05-03 ENCOUNTER — Ambulatory Visit
Admission: RE | Admit: 2023-05-03 | Discharge: 2023-05-03 | Disposition: A | Discharge status: Home / Self Care | Payer: Commercial Managed Care - PPO | Source: Ambulatory Visit | Attending: Radiation Oncology | Admitting: Radiation Oncology

## 2023-05-03 VITALS — BP 129/68 | HR 81 | Temp 97.8°F | Resp 20 | Ht 59.0 in | Wt 165.0 lb

## 2023-05-03 DIAGNOSIS — F17290 Nicotine dependence, other tobacco product, uncomplicated: Secondary | ICD-10-CM | POA: Insufficient documentation

## 2023-05-03 DIAGNOSIS — D0511 Intraductal carcinoma in situ of right breast: Secondary | ICD-10-CM | POA: Insufficient documentation

## 2023-05-03 DIAGNOSIS — Z510 Encounter for antineoplastic radiation therapy: Secondary | ICD-10-CM | POA: Diagnosis present

## 2023-05-03 NOTE — Progress Notes (Signed)
 Hematology/Oncology Consult note [facility]  [phone] [phone]  Patient Care Team: Lauro Regulus, MD as PCP - General (Internal Medicine) Creig Hines, MD as Consulting Physician (Oncology) Creig Hines, MD as Consulting Physician (Oncology) Hulen Luster, RN as Oncology Nurse Navigator Carmina Miller, MD as Consulting Physician (Radiation Oncology)   Name of the patient: Wendy Russell  [id number]  08-02-1970   Date of visit: 05/03/23  Diagnosis-right breast DCIS ER negative  Chief complaint/ Reason for visit-discuss final pathology results and further management  Heme/Onc history: Patient is a 53 year old female who was seen by me in the past back in 2022 for macrocytosis 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  She is now being referred for new diagnosis of DCIS of the right breast.Patient had a screening mammogram in January 2025 which was followed by diagnostic mammogram which showed 10 mm group of calcifications in the right breast.  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  Bilateral breast MRI was recommended given family history and high-grade DCIS.  Patient has been seen by Dr. Tonna Boehringer and plan is for lumpectomy on 04/15/2023.   No personal history of breast cancer or breast biopsies.  Family history significant for breast cancer in her mother and maternal cousin.  Mother also had ovarian cancer.  History of stomach cancer in maternal aunt.  She is G2 P2.  Age at first birth 67.  Hysterectomy in 2019.  No use of any OCPs or hormone replacement therapy.   Patient had lumpectomy on 04/15/2023.  Final pathology showed high-grade DCIS comedo type with calcifications no evidence of invasive cancer noma.  Margins negative for DCIS at 7 mm.  4 sentinel lymph nodes negative for malignancy.  ER testing was repeated on final biopsy specimen as well and was negative.  Genetic testing was negative as well  Interval  history-she is healing well from her lumpectomy presently.  Denies any specific complaints at this time  ECOG PS- 0 Pain scale- 0   Review of systems- Review of Systems  Constitutional:  Negative for chills, fever, malaise/fatigue and weight loss.  HENT:  Negative for congestion, ear discharge and nosebleeds.   Eyes:  Negative for blurred vision.  Respiratory:  Negative for cough, hemoptysis, sputum production, shortness of breath and wheezing.   Cardiovascular:  Negative for chest pain, palpitations, orthopnea and claudication.  Gastrointestinal:  Negative for abdominal pain, blood in stool, constipation, diarrhea, heartburn, melena, nausea and vomiting.  Genitourinary:  Negative for dysuria, flank pain, frequency, hematuria and urgency.  Musculoskeletal:  Negative for back pain, joint pain and myalgias.  Skin:  Negative for rash.  Neurological:  Negative for dizziness, tingling, focal weakness, seizures, weakness and headaches.  Endo/Heme/Allergies:  Does not bruise/bleed easily.  Psychiatric/Behavioral:  Negative for depression and suicidal ideas. The patient does not have insomnia.       Allergies  Allergen Reactions   Codeine Anaphylaxis   Aspirin Other (See Comments)    Nose bleeds   Cat Dander Itching   Doxycycline Nausea And Vomiting   Tramadol     Tingling in face   Dog Epithelium (Canis Lupus Familiaris) Itching     Past Medical History:  Diagnosis Date   Anemia    H/O   Anxiety    Arthritis    HANDS   Claustrophobia    COVID 10/2019   monocloncal atb and got it 10/09/2019   Dizziness    Ductal carcinoma in situ (DCIS)  of right breast    Family history of adverse reaction to anesthesia    mom-delayed emergence    sister-unsure of reaction   GERD (gastroesophageal reflux disease)    OCC-TUMS PRN   Macrocytosis 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    Migraines    OSA on CPAP      Past Surgical History:  Procedure Laterality Date   BREAST BIOPSY Right 03/14/2023   Stereo  bx, x-clip, DUCTAL CARCINOMA IN SITU, HIGH GRADE,   BREAST BIOPSY Right 03/14/2023   MM RT BREAST BX W LOC DEV 1ST LESION IMAGE BX SPEC STEREO GUIDE 03/14/2023 [facility]   BREAST BIOPSY Right 04/05/2023   MM RT RADIO FREQUENCY TAG LOC MAMMO GUIDE 04/05/2023 [facility]   CESAREAN SECTION     X2   DILITATION & CURRETTAGE/HYSTROSCOPY WITH NOVASURE ABLATION N/A 07/08/2017   Procedure: DILATATION & CURETTAGE/HYSTEROSCOPY WITH NOVASURE ABLATION;  Surgeon: Suzy Bouchard, MD;  Location: [facility];  Service: Gynecology;  Laterality: N/A;   HEMORRHOID SURGERY N/A 09/22/2018   Procedure: OPEN HEMORRHOIDECTOMY;  Surgeon: Sung Amabile, DO;  Location: [facility];  Service: General;  Laterality: N/A;   LAPAROSCOPIC BILATERAL SALPINGECTOMY Bilateral 11/22/2017   Procedure: LAPAROSCOPIC BILATERAL SALPINGECTOMY;  Surgeon: Suzy Bouchard, MD;  Location: [facility];  Service: Gynecology;  Laterality: Bilateral;   LAPAROSCOPIC SUPRACERVICAL HYSTERECTOMY N/A 11/22/2017   Procedure: LAPAROSCOPIC SUPRACERVICAL HYSTERECTOMY;  Surgeon: Schermerhorn, Ihor Austin, MD;  Location: [facility];  Service: Gynecology;  Laterality: N/A;   PART MASTECTOMY,RADIO FREQUENCY LOCALIZER,AXILLARY SENTINEL NODE BIOPSY Right 04/15/2023   Procedure: PART MASTECTOMY,RADIO FREQUENCY LOCALIZER,AXILLARY SENTINEL NODE BIOPSY;  Surgeon: Sung Amabile, DO;  Location: [facility];  Service: General;  Laterality: Right;   TUBAL LIGATION  1993   WISDOM TOOTH EXTRACTION      Social History   Socioeconomic History   Marital status: Married    Spouse name: Not on file   Number of children: Not on file   Years of education: Not on file   Highest education level: Not on file  Occupational History   Not on file  Tobacco Use   Smoking status: Former    Current packs/day: 0.50    Average packs/day: 0.5 packs/day for 23.0 years (11.5 ttl pk-yrs)    Types: Cigarettes   Smokeless tobacco: Never  Vaping Use   Vaping status: Every Day    Substances: Flavoring  Substance and Sexual Activity   Alcohol use: Yes    Comment: rare   Drug use: No   Sexual activity: Yes  Other Topics Concern   Not on file  Social History Narrative   Not on file   Social Drivers of Health   Financial Resource Strain: Not on file  Food Insecurity: No Food Insecurity (03/22/2023)   Hunger Vital Sign    Worried About Running Out of Food in the Last Year: Never true    Ran Out of Food in the Last Year: Never true  Transportation Needs: No Transportation Needs (03/22/2023)   PRAPARE - Administrator, Civil Service (Medical): No    Lack of Transportation (Non-Medical): No  Physical Activity: Not on file  Stress: Not on file  Social Connections: Not on file  Intimate Partner Violence: Not At Risk (03/22/2023)   Humiliation, Afraid, Rape, and Kick questionnaire    Fear of Current or Ex-Partner: No    Emotionally Abused: No    Physically Abused: No    Sexually Abused: No    Family History  Problem Relation Age of Onset  Breast cancer Mother 48     Current Outpatient Medications:    acetaminophen (TYLENOL) 500 MG tablet, Take 500-1,000 mg by mouth every 6 (six) hours as needed (pain.)., Disp: , Rfl:    azelastine (ASTELIN) 0.1 % nasal spray, Place 1 spray into both nostrils 2 (two) times daily. Use in each nostril as directed, Disp: , Rfl:    cetirizine (ZYRTEC) 10 MG tablet, Take 10 mg by mouth in the morning., Disp: , Rfl:    escitalopram (LEXAPRO) 10 MG tablet, Take 10 mg by mouth at bedtime., Disp: , Rfl:    gabapentin (NEURONTIN) 300 MG capsule, Take 300 mg by mouth 3 (three) times daily., Disp: , Rfl:   Physical exam: There were no vitals filed for this visit. Physical Exam Cardiovascular:     Rate and Rhythm: Normal rate and regular rhythm.     Heart sounds: Normal heart sounds.  Pulmonary:     Effort: Pulmonary effort is normal.  Skin:    General: Skin is warm and dry.  Neurological:     Mental Status: She is  alert and oriented to person, place, and time.      I have personally reviewed labs listed below:    Latest Ref Rng & Units 04/08/2023   12:56 PM  CMP  Glucose 70 - 99 mg/dL 94   BUN 6 - 20 mg/dL 12   Creatinine 4.09 - 1.00 mg/dL 8.11   Sodium 914 - 782 mmol/L 138   Potassium 3.5 - 5.1 mmol/L 3.8   Chloride 98 - 111 mmol/L 102   CO2 22 - 32 mmol/L 27   Calcium 8.9 - 10.3 mg/dL 9.5       Latest Ref Rng & Units 04/08/2023   12:56 PM  CBC  WBC 4.0 - 10.5 K/uL 7.5   Hemoglobin 12.0 - 15.0 g/dL 95.6   Hematocrit 21.3 - 46.0 % 42.4   Platelets 150 - 400 K/uL 291    I have personally reviewed Radiology images listed below: No images are attached to the encounter.  DG BREAST SURGICAL SPECIMEN NO CHARGE Result Date: 04/15/2023 This procedure is a no report and no charge.  It will auto finalize.  MM Breast Surgical Specimen Result Date: 04/15/2023 CLINICAL DATA:  Status post [facility] localized right breast lumpectomy. EXAM: SPECIMEN RADIOGRAPH OF THE RIGHT BREAST COMPARISON:  Previous exam(s). FINDINGS: Status post excision of the right breast. The [facility] reflector and X shaped clip are present within the specimen. IMPRESSION: Specimen radiograph of the right breast. Electronically Signed   By: Frederico Hamman M.D.   On: 04/15/2023 09:50   NM Sentinel Node Inj-No Rpt (Breast) Result Date: 04/15/2023 Sulfur Colloid was injected by the Nuclear Medicine Technologist for sentinel lymph node localization.   MM RT RADIO FREQUENCY TAG LOC MAMMO GUIDE Result Date: 04/05/2023 CLINICAL DATA:  Biopsy-proven DCIS in the right breast (X clip). EXAM: NEEDLE LOCALIZATION OF THE RIGHT BREAST WITH MAMMO GUIDANCE COMPARISON:  Previous exam(s). FINDINGS: Patient presents for needle localization prior to right breast lumpectomy. I met with the patient and we discussed the procedure of needle localization including benefits and alternatives. We discussed the high likelihood of a successful procedure. We  discussed the risks of the procedure, including infection, bleeding, tissue injury, and further surgery. Informed, written consent was given. The usual time-out protocol was performed immediately prior to the procedure. Using mammographic guidance, sterile technique, 1% lidocaine and a 10 cm SAVI SCOUT needle, the X clip was localized  using a superior approach. The images were marked for Dr. Sung Amabile. IMPRESSION: Radar reflector localization of the RIGHT breast. No apparent complications. Electronically Signed   By: Jacob Moores M.D.   On: 04/05/2023 16:11     Assessment and plan- Patient is a 53 y.o. female with right breast DCIS ER negative s/p lumpectomy here to discuss further management  Discussed the results of final pathology With the patient in detail which shows at least 5 mm DCIS grade 3 with comedonecrosis and negative margins of 7 mm.  ER/PR was negative on the biopsy specimen and was repeated on final pathology specimen as well and was negative.  There would be no role for endocrine therapy for ER negative DCIS.  She is meeting with radiation oncology to discuss adjuvant radiation therapy following lumpectomy.  I will see her back in 6 months no labs.  Surveillance would include routine breast exams and yearly mammograms moving forward.   Cancer Staging  Ductal carcinoma in situ (DCIS) of right breast Staging form: Breast, AJCC 8th Edition - Clinical stage from 03/22/2023: Stage 0 (cTis (DCIS), cN0(f), cM0, G3, ER-, PR-, HER2: Not Assessed) - Signed by Creig Hines, MD on 03/23/2023 Stage prefix: Initial diagnosis Method of lymph node assessment: Core biopsy Nuclear grade: G3 Histologic grading system: 3 grade system - Pathologic stage from 05/03/2023: Stage 0 (pTis (DCIS), pN0(sn), cM0, ER-, PR-, HER2: Not Assessed) - Signed by Creig Hines, MD on 05/03/2023 Method of lymph node assessment: Sentinel lymph node biopsy Nuclear grade: G3 Multigene prognostic tests performed:  None     Visit Diagnosis 1. Ductal carcinoma in situ (DCIS) of right breast      Dr. Owens Shark, MD, MPH [facility] [phone] 05/03/2023 12:55 PM

## 2023-05-03 NOTE — Consult Note (Signed)
 NEW PATIENT EVALUATION  Name: Wendy Russell  MRN: [id number]  Date:   05/03/2023     DOB: 10-04-1970   This 53 y.o. 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  REFERRING PHYSICIAN: Creig Hines, MD  CHIEF COMPLAINT: No chief complaint on file.   DIAGNOSIS: The encounter diagnosis was Ductal carcinoma in situ (DCIS) of right breast.   PREVIOUS INVESTIGATIONS:  Mammograms MRI scans and ultrasound reviewed Pathology reports reviewed Clinical notes reviewed  HPI: Patient is a 53 year old female who presented with an abnormal mammogram of the right breast showing calcifications warranting further evaluation.  Breast MRI confirmed a 1.4 cm span of lung linear non-mass enhancement of the right breast in the upper outer quadrant.  This corresponded to area of biopsy-proven DCIS.  Initial targeted biopsy showed high-grade solid and cribriform ductal carcinoma in situ with necrosis present.  She underwent wide local excision and sentinel node biopsy showing again high-grade ductal carcinoma in situ comedonecrosis with calcifications.  Tumor spanned at least 5 mm.  Margins were clear at 7 mm.  4 sentinel lymph nodes were examined all negative for metastatic disease. Tumor was ER negative.  She has done well postoperatively.  She is seen medical oncology is now referred to radiation oncology for opinion.  She specifically denies breast tenderness cough or bone pain. PLANNED TREATMENT REGIMEN: Right hypofractionated whole breast radiation  PAST MEDICAL HISTORY:  has a past medical history of Anemia, Anxiety, Arthritis, Claustrophobia, COVID (10/2019), Dizziness, Ductal carcinoma in situ (DCIS) of right breast, Family history of adverse reaction to anesthesia, GERD (gastroesophageal reflux disease), Macrocytosis 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, Migraines, and OSA on  CPAP.    PAST SURGICAL HISTORY:  Past Surgical History:  Procedure Laterality Date   BREAST BIOPSY Right 03/14/2023   Stereo bx, x-clip, DUCTAL CARCINOMA IN SITU, HIGH GRADE,   BREAST BIOPSY Right 03/14/2023   MM RT BREAST BX W LOC DEV 1ST LESION IMAGE BX SPEC STEREO GUIDE 03/14/2023 [facility]   BREAST BIOPSY Right 04/05/2023   MM RT RADIO FREQUENCY TAG LOC MAMMO GUIDE 04/05/2023 [facility]   CESAREAN SECTION     X2   DILITATION & CURRETTAGE/HYSTROSCOPY WITH NOVASURE ABLATION N/A 07/08/2017   Procedure: DILATATION & CURETTAGE/HYSTEROSCOPY WITH NOVASURE ABLATION;  Surgeon: Suzy Bouchard, MD;  Location: [facility];  Service: Gynecology;  Laterality: N/A;   HEMORRHOID SURGERY N/A 09/22/2018   Procedure: OPEN HEMORRHOIDECTOMY;  Surgeon: Sung Amabile, DO;  Location: [facility];  Service: General;  Laterality: N/A;   LAPAROSCOPIC BILATERAL SALPINGECTOMY Bilateral 11/22/2017   Procedure: LAPAROSCOPIC BILATERAL SALPINGECTOMY;  Surgeon: Suzy Bouchard, MD;  Location: [facility];  Service: Gynecology;  Laterality: Bilateral;   LAPAROSCOPIC SUPRACERVICAL HYSTERECTOMY N/A 11/22/2017   Procedure: LAPAROSCOPIC SUPRACERVICAL HYSTERECTOMY;  Surgeon: Schermerhorn, Ihor Austin, MD;  Location: [facility];  Service: Gynecology;  Laterality: N/A;   PART MASTECTOMY,RADIO FREQUENCY LOCALIZER,AXILLARY SENTINEL NODE BIOPSY Right 04/15/2023   Procedure: PART MASTECTOMY,RADIO FREQUENCY LOCALIZER,AXILLARY SENTINEL NODE BIOPSY;  Surgeon: Sung Amabile, DO;  Location: [facility];  Service: General;  Laterality: Right;   TUBAL LIGATION  1993   WISDOM TOOTH EXTRACTION      FAMILY HISTORY: family history includes Breast cancer (age of onset: 24) in her mother.  SOCIAL HISTORY:  reports that she has quit smoking. Her smoking use included cigarettes. She has a 11.5 pack-year smoking history. She  has never used smokeless tobacco. She reports current alcohol use. She reports that she does not use  drugs.  ALLERGIES: Codeine, Aspirin, Cat dander, Doxycycline, Tramadol, and Dog epithelium (canis lupus familiaris)  MEDICATIONS:  Current Outpatient Medications  Medication Sig Dispense Refill   acetaminophen (TYLENOL) 500 MG tablet Take 500-1,000 mg by mouth every 6 (six) hours as needed (pain.).     azelastine (ASTELIN) 0.1 % nasal spray Place 1 spray into both nostrils 2 (two) times daily. Use in each nostril as directed     escitalopram (LEXAPRO) 10 MG tablet Take 10 mg by mouth at bedtime.     gabapentin (NEURONTIN) 300 MG capsule Take 300 mg by mouth 3 (three) times daily.     nortriptyline (PAMELOR) 10 MG capsule Take 10 mg by mouth at bedtime. nortriptyline 10 mg at night for one week then increase to 20 mg at night for headaches, migraines, and anxiety.     No current facility-administered medications for this encounter.    ECOG PERFORMANCE STATUS:  0 - Asymptomatic  REVIEW OF SYSTEMS: Patient denies any weight loss, fatigue, weakness, fever, chills or night sweats. Patient denies any loss of vision, blurred vision. Patient denies any ringing  of the ears or hearing loss. No irregular heartbeat. Patient denies heart murmur or history of fainting. Patient denies any chest pain or pain radiating to her upper extremities. Patient denies any shortness of breath, difficulty breathing at night, cough or hemoptysis. Patient denies any swelling in the lower legs. Patient denies any nausea vomiting, vomiting of blood, or coffee ground material in the vomitus. Patient denies any stomach pain. Patient states has had normal bowel movements no significant constipation or diarrhea. Patient denies any dysuria, hematuria or significant nocturia. Patient denies any problems walking, swelling in the joints or loss of balance. Patient denies any skin changes, loss of hair or loss of weight. Patient denies any excessive worrying or anxiety or significant depression. Patient denies any problems with  insomnia. Patient denies excessive thirst, polyuria, polydipsia. Patient denies any swollen glands, patient denies easy bruising or easy bleeding. Patient denies any recent infections, allergies or URI. Patient "s visual fields have not changed significantly in recent time.   PHYSICAL EXAM: LMP 06/20/2017 Comment: upreg neg She has had a wide local excision of the right breast some minor ecchymosis surrounding the scar is noted.  No dominant masses noted in either breast.  No axillary or supraclavicular adenopathy is identified.  Well-developed well-nourished patient in NAD. HEENT reveals PERLA, EOMI, discs not visualized.  Oral cavity is clear. No oral mucosal lesions are identified. Neck is clear without evidence of cervical or supraclavicular adenopathy. Lungs are clear to A&P. Cardiac examination is essentially unremarkable with regular rate and rhythm without murmur rub or thrill. Abdomen is benign with no organomegaly or masses noted. Motor sensory and DTR levels are equal and symmetric in the upper and lower extremities. Cranial nerves II through XII are grossly intact. Proprioception is intact. No peripheral adenopathy or edema is identified. No motor or sensory levels are noted. Crude visual fields are within normal range.  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  Will treat her whole  breast over 3 weeks boosting her scar another 1000 centigrade using photon beam therapy.  Risks and benefits of treatment including skin reaction fatigue alteration blood counts possible inclusion of superficial lung all were described in detail to the patient.  She  comprehends my treatment plan well.  I have personally set up and ordered CT simulation for this week.  Patient will not benefit from endocrine therapy based on ER negative nature of her DCIS.  Patient comprehends my recommendations well.  I would like to take this opportunity to thank you for allowing me to participate in the care of your patient.Carmina Miller, MD

## 2023-05-04 ENCOUNTER — Ambulatory Visit: Payer: Commercial Managed Care - PPO | Attending: Oncology | Admitting: Occupational Therapy

## 2023-05-04 DIAGNOSIS — R293 Abnormal posture: Secondary | ICD-10-CM | POA: Diagnosis present

## 2023-05-04 DIAGNOSIS — M25611 Stiffness of right shoulder, not elsewhere classified: Secondary | ICD-10-CM | POA: Diagnosis present

## 2023-05-04 NOTE — Therapy (Signed)
 OUTPATIENT OCCUPATIONAL THERAPY BREAST CANCER POSTOP TREATMENT   Patient Name: Wendy Russell MRN: [id number] DOB:1970/05/07, 53 y.o., female Today's Date: 05/04/2023  END OF SESSION:  OT End of Session - 05/04/23 1557     Visit Number 2    Number of Visits 6    Date for OT Re-Evaluation 05/11/23    OT Start Time 1001    OT Stop Time 1030    OT Time Calculation (min) 29 min    Activity Tolerance Patient tolerated treatment well    Behavior During Therapy WFL for tasks assessed/performed             Past Medical History:  Diagnosis Date   Anemia    H/O   Anxiety    Arthritis    HANDS   Claustrophobia    COVID 10/2019   monocloncal atb and got it 10/09/2019   Dizziness    Ductal carcinoma in situ (DCIS) of right breast    Family history of adverse reaction to anesthesia    mom-delayed emergence    sister-unsure of reaction   GERD (gastroesophageal reflux disease)    OCC-TUMS PRN   Macrocytosis 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    Migraines    OSA on CPAP    Past Surgical History:  Procedure Laterality Date   BREAST BIOPSY Right 03/14/2023   Stereo bx, x-clip, DUCTAL CARCINOMA IN SITU, HIGH GRADE,   BREAST BIOPSY Right 03/14/2023   MM RT BREAST BX W LOC DEV 1ST LESION IMAGE BX SPEC STEREO GUIDE 03/14/2023 [facility]   BREAST BIOPSY Right 04/05/2023   MM RT RADIO FREQUENCY TAG LOC MAMMO GUIDE 04/05/2023 [facility]   CESAREAN SECTION     X2   DILITATION & CURRETTAGE/HYSTROSCOPY WITH NOVASURE ABLATION N/A 07/08/2017   Procedure: DILATATION & CURETTAGE/HYSTEROSCOPY WITH NOVASURE ABLATION;  Surgeon: Suzy Bouchard, MD;  Location: [facility];  Service: Gynecology;  Laterality: N/A;   HEMORRHOID SURGERY N/A 09/22/2018   Procedure: OPEN HEMORRHOIDECTOMY;  Surgeon: Sung Amabile, DO;  Location: [facility];  Service: General;  Laterality: N/A;   LAPAROSCOPIC BILATERAL SALPINGECTOMY Bilateral 11/22/2017   Procedure: LAPAROSCOPIC BILATERAL SALPINGECTOMY;  Surgeon: Suzy Bouchard, MD;  Location: [facility];  Service: Gynecology;  Laterality: Bilateral;   LAPAROSCOPIC SUPRACERVICAL HYSTERECTOMY N/A 11/22/2017   Procedure: LAPAROSCOPIC SUPRACERVICAL HYSTERECTOMY;  Surgeon: Schermerhorn, Ihor Austin, MD;  Location: [facility];  Service: Gynecology;  Laterality: N/A;   PART MASTECTOMY,RADIO FREQUENCY LOCALIZER,AXILLARY SENTINEL NODE BIOPSY Right 04/15/2023   Procedure: PART MASTECTOMY,RADIO FREQUENCY LOCALIZER,AXILLARY SENTINEL NODE BIOPSY;  Surgeon: Sung Amabile, DO;  Location: [facility];  Service: General;  Laterality: Right;   TUBAL LIGATION  1993   WISDOM TOOTH EXTRACTION     Patient Active Problem List   Diagnosis Date Noted   Genetic testing 04/18/2023   Ductal carcinoma in situ (DCIS) of right breast 03/23/2023   Left lower quadrant pain 07/26/2018   Headache disorder 04/03/2018   Post-operative state 11/22/2017   Healthcare maintenance 06/17/2017   Migraine without aura and without status migrainosus, not intractable 12/08/2015   Anxiety, generalized 12/05/2015    PCP: Dr Delano Metz PROVIDER: Dr Glade Stanford DIAG: R breast Cancer  THERAPY DIAG:  Abnormal posture  Stiffness of right shoulder, not elsewhere classified  Rationale for Evaluation and Treatment:   ONSET DATE: 03/09/23  SUBJECTIVE:  SUBJECTIVE STATEMENT: I am doing okay.  Done the exercises like you told me.  I stop when I feeling a pull.  I am starting radiation.  PERTINENT HISTORY:  Patient was diagnosed with right  breast cancer - had  R lumpectomy on 04/15/2023 by Dr. Tonna Boehringer.  4 ln removed - was negative. Radiation simulation coming up - no chemo  PATIENT GOALS:   reduce lymphedema risk and learn post op HEP.   PAIN:  Are you having pain? No  PRECAUTIONS: Active CA     HAND DOMINANCE:  right  WEIGHT BEARING RESTRICTIONS: No  FALLS:  Has patient fallen in last 6 months? No  LIVING ENVIRONMENT: Patient lives with: Several family members-daughter grandkids mother-in-law.  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  LEISURE: Does all home making activities.  Loves to do crocheting and knitting and photography -assist family   OBJECTIVE:  COGNITION: Overall cognitive status: Within functional limits for tasks assessed    POSTURE:  Forward head and rounded shoulders posture  UPPER EXTREMITY AROM/PROM:   Bilateral shoulder active range of motion within normal limits. At eval   TODAY SESSION 05/04/23 Patient arrive with right shoulder flexion abduction to about 90 degrees.  External rotation reaching for here within normal limits.  Patient feeling a slight pull.  Patient scar healing no scar adhesions.  Reviewed with patient home program -reinforced again to do in supine.  Patient can do active assisted range of motion with wand in supine for shoulder flexion as well as abduction followed by external rotation.  Patient to focus on overhead external rotation to simulate radiation position. 10 reps each way.  Patient tolerated very well.  Showed great improvement in session. Patient to perform home exercises 3 times a day 10 reps with a slight pull 2/10.   CERVICAL AROM: All within normal limits:    UPPER EXTREMITY STRENGTH: 5/5 for shoulder in all planes  LYMPHEDEMA ASSESSMENTS:   LANDMARK RIGHT   eval  10 cm proximal to olecranon process 34.5 at 15 cm  31.5  Olecranon process 25.5  10 cm proximal to ulnar styloid process   Just proximal to ulnar styloid process   Across hand at thumb web space   At base of 2nd digit   (Blank rows = not tested)  LANDMARK LEFT   eval  10 cm proximal to olecranon process 33.4 cm at 15 cm  30 cm  Olecranon process 24.5  10 cm proximal to ulnar styloid  process   Just proximal to ulnar styloid process   Across hand at thumb web space   At base of 2nd digit   (Blank rows = not tested)  L-DEX LYMPHEDEMA SCREENING:  The patient was assessed using the L-Dex machine today to produce a lymphedema index baseline score. The patient will be reassessed on a regular basis (typically every 3 months) to obtain new L-Dex scores. If the score is > 6.5 points away from his/her baseline score indicating onset of subclinical lymphedema, it will be recommended to wear a compression garment for 4 weeks, 12 hours per day and then be reassessed. If the score continues to be > 6.5 points from baseline at reassessment, we will initiate lymphedema treatment. Assessing in this manner has a 95% rate of preventing clinically significant lymphedema.   L-DEX FLOWSHEETS - 05/04/23 1600       L-DEX LYMPHEDEMA SCREENING   Measurement Type Unilateral  L-DEX MEASUREMENT EXTREMITY Upper Extremity    POSITION  Standing    DOMINANT SIDE Right    At Risk Side Right    BASELINE SCORE (UNILATERAL) 6.4    L-DEX SCORE (UNILATERAL) 0.8    VALUE CHANGE (UNILAT) -5.6               PATIENT EDUCATION:  Education details: Lymphedema risk reduction initiated will review after surgery and post op shoulder/posture HEP Person educated: Patient Education method: Explanation, Demonstration, Handout Education comprehension: Patient verbalized understanding and returned demonstration  HOME EXERCISE PROGRAM: Patient was instructed  again today in a home exercise program  for post op shoulder range of motion. These included active assist shoulder flexion and abduction in supine as well as external rotation in supine.  She was encouraged to do these 2-3 x day, holding 3 seconds and repeating 10 times when permitted by her physician/surgeon.Marland Kitchen  Keep exercises pain-free.   ASSESSMENT:  CLINICAL IMPRESSION: Patient returns for postop visit for right lumpectomy by Dr. Tonna Boehringer on  04/15/2023.Marland Kitchen  Patient had 4 lymph nodes removed.  Patient starting radiation.  Patient arrive with right shoulder range of motion to about 90 degrees flexion abduction external rotation within functional limits for grooming.  Reinforced with patient again to perform home exercises in supine with gravity and can work on overhead.  In session reviewed shoulder flexion abduction external rotation home exercises in supine.  Patient tolerating really well.  She great progress in session.  Patient to continue with her home exercises until radiation starting and through radiation.  Patient will follow-up with me again halfway through radiation she will benefit from a post op OT reassessment to determine needs and from L-Dex screens every 3 months for 2 years to detect subclinical lymphedema.  Pt will benefit from skilled therapeutic intervention to improve on the following deficits: Decreased knowledge of precautions and lymphedema education, impaired UE functional use, pain, decreased ROM, postural dysfunction.   OT treatment/interventions: ADL/self-care home management, pt/family education, therapeutic exercise,manual therapy  REHAB POTENTIAL: Good  CLINICAL DECISION MAKING: Stable/uncomplicated  EVALUATION COMPLEXITY: Low   GOALS: Goals reviewed with patient? YES  LONG TERM GOALS: (STG=LTG)    Name Target Date Goal status  1 Pt will be able to verbalize understanding of pertinent lymphedema risk reduction practices relevant to her dx specifically related to skin care.  Baseline:  No knowledge 6 weeks Initially  2 Pt will be able to return demo and/or verbalize understanding of the post op HEP related to regaining shoulder ROM. Baseline:  No knowledge 6 weeks Initially       4 Pt will demo she has regained full shoulder ROM and function post operatively compared to baselines.  Baseline: See objective measurements taken today. 6 weeks Initial    PLAN:  OT FREQUENCY/DURATION: EVAL and 4 follow  up appointment.   PLAN FOR NEXT SESSION: will reassess 3-4 weeks post op to determine needs.   Patient will follow up at outpatient cancer rehab 3-4 weeks following surgery.  If the patient requires occupational therapy at that time, a specific plan will be dictated and sent to the referring physician for approval. T Occupational Therapy Information for After Breast Cancer Surgery/Treatment:  Lymphedema is a swelling condition that you may be at risk for in your arm if you have lymph nodes removed from the armpit area.  After a sentinel node biopsy, the risk is approximately 5-9% and is higher after an axillary node dissection.  There is treatment available  for this condition and it is not life-threatening.  Contact your physician or occupational therapist with concerns. You may begin the 4 shoulder/posture exercises (see additional sheet) when permitted by your physician (typically a week after surgery).  If you have drains, you may need to wait until those are removed before beginning range of motion exercises.  A general recommendation is to not lift your arms above shoulder height until drains are removed.  These exercises should be done to your tolerance and gently.  This is not a "no pain/no gain" type of recovery so listen to your body and stretch into the range of motion that you can tolerate, stopping if you have pain.  If you are having immediate reconstruction, ask your plastic surgeon about doing exercises as he or she may want you to wait. .  While undergoing any medical procedure or treatment, try to avoid blood pressure being taken or needle sticks from occurring on the arm on the side of cancer.   This recommendation begins after surgery and continues for the rest of your life.  This may help reduce your risk of getting lymphedema (swelling in your arm). An excellent resource for those seeking information on lymphedema is the National Lymphedema Network's web site. It can be accessed at  www.lymphnet.org If you notice swelling in your hand, arm or breast at any time following surgery (even if it is many years from now), please contact your doctor or occupational therapist to discuss this.  Lymphedema can be treated at any time but it is easier for you if it is treated early on.  If you feel like your shoulder motion is not returning to normal in a reasonable amount of time, please contact your surgeon or occupational therapist.  [facility] [phone]. [address] [place], [place] [address]      Oletta Cohn, OTR/L,CLT 05/04/2023, 4:04 PM

## 2023-05-05 ENCOUNTER — Ambulatory Visit
Admission: RE | Admit: 2023-05-05 | Discharge: 2023-05-05 | Discharge status: Home / Self Care | Source: Ambulatory Visit | Attending: Radiation Oncology | Admitting: Radiation Oncology

## 2023-05-05 ENCOUNTER — Encounter: Payer: Self-pay | Admitting: *Deleted

## 2023-05-05 DIAGNOSIS — Z510 Encounter for antineoplastic radiation therapy: Secondary | ICD-10-CM | POA: Diagnosis not present

## 2023-05-06 ENCOUNTER — Other Ambulatory Visit: Payer: Self-pay | Admitting: *Deleted

## 2023-05-06 DIAGNOSIS — D0511 Intraductal carcinoma in situ of right breast: Secondary | ICD-10-CM

## 2023-05-10 DIAGNOSIS — Z510 Encounter for antineoplastic radiation therapy: Secondary | ICD-10-CM | POA: Diagnosis not present

## 2023-05-12 ENCOUNTER — Ambulatory Visit
Admission: RE | Admit: 2023-05-12 | Discharge: 2023-05-12 | Disposition: A | Discharge status: Home / Self Care | Source: Ambulatory Visit | Attending: Radiation Oncology | Admitting: Radiation Oncology

## 2023-05-12 DIAGNOSIS — Z510 Encounter for antineoplastic radiation therapy: Secondary | ICD-10-CM | POA: Diagnosis not present

## 2023-05-16 ENCOUNTER — Ambulatory Visit
Admission: RE | Admit: 2023-05-16 | Discharge: 2023-05-16 | Disposition: A | Discharge status: Home / Self Care | Source: Ambulatory Visit | Attending: Radiation Oncology | Admitting: Radiation Oncology

## 2023-05-16 ENCOUNTER — Other Ambulatory Visit: Payer: Self-pay

## 2023-05-16 DIAGNOSIS — Z510 Encounter for antineoplastic radiation therapy: Secondary | ICD-10-CM | POA: Diagnosis not present

## 2023-05-16 LAB — RAD ONC ARIA SESSION SUMMARY
Course Elapsed Days: 0
Plan Fractions Treated to Date: 1
Plan Prescribed Dose Per Fraction: 2.66 Gy
Plan Total Fractions Prescribed: 16
Plan Total Prescribed Dose: 42.56 Gy
Reference Point Dosage Given to Date: 2.66 Gy
Reference Point Session Dosage Given: 2.66 Gy
Session Number: 1

## 2023-05-17 ENCOUNTER — Ambulatory Visit
Admission: RE | Admit: 2023-05-17 | Discharge: 2023-05-17 | Disposition: A | Discharge status: Home / Self Care | Source: Ambulatory Visit | Attending: Radiation Oncology | Admitting: Radiation Oncology

## 2023-05-17 ENCOUNTER — Other Ambulatory Visit: Payer: Self-pay

## 2023-05-17 DIAGNOSIS — Z510 Encounter for antineoplastic radiation therapy: Secondary | ICD-10-CM | POA: Diagnosis not present

## 2023-05-17 LAB — RAD ONC ARIA SESSION SUMMARY
Course Elapsed Days: 1
Plan Fractions Treated to Date: 2
Plan Prescribed Dose Per Fraction: 2.66 Gy
Plan Total Fractions Prescribed: 16
Plan Total Prescribed Dose: 42.56 Gy
Reference Point Dosage Given to Date: 5.32 Gy
Reference Point Session Dosage Given: 2.66 Gy
Session Number: 2

## 2023-05-18 ENCOUNTER — Other Ambulatory Visit: Payer: Self-pay

## 2023-05-18 ENCOUNTER — Ambulatory Visit
Admission: RE | Admit: 2023-05-18 | Discharge: 2023-05-18 | Disposition: A | Discharge status: Home / Self Care | Source: Ambulatory Visit | Attending: Radiation Oncology | Admitting: Radiation Oncology

## 2023-05-18 DIAGNOSIS — Z510 Encounter for antineoplastic radiation therapy: Secondary | ICD-10-CM | POA: Diagnosis not present

## 2023-05-18 LAB — RAD ONC ARIA SESSION SUMMARY
Course Elapsed Days: 2
Plan Fractions Treated to Date: 3
Plan Prescribed Dose Per Fraction: 2.66 Gy
Plan Total Fractions Prescribed: 16
Plan Total Prescribed Dose: 42.56 Gy
Reference Point Dosage Given to Date: 7.98 Gy
Reference Point Session Dosage Given: 2.66 Gy
Session Number: 3

## 2023-05-19 ENCOUNTER — Ambulatory Visit
Admission: RE | Admit: 2023-05-19 | Discharge: 2023-05-19 | Disposition: A | Discharge status: Home / Self Care | Source: Ambulatory Visit | Attending: Radiation Oncology | Admitting: Radiation Oncology

## 2023-05-19 ENCOUNTER — Inpatient Hospital Stay

## 2023-05-19 ENCOUNTER — Other Ambulatory Visit: Payer: Self-pay

## 2023-05-19 DIAGNOSIS — Z510 Encounter for antineoplastic radiation therapy: Secondary | ICD-10-CM | POA: Diagnosis not present

## 2023-05-19 LAB — RAD ONC ARIA SESSION SUMMARY
Course Elapsed Days: 3
Plan Fractions Treated to Date: 4
Plan Prescribed Dose Per Fraction: 2.66 Gy
Plan Total Fractions Prescribed: 16
Plan Total Prescribed Dose: 42.56 Gy
Reference Point Dosage Given to Date: 10.64 Gy
Reference Point Session Dosage Given: 2.66 Gy
Session Number: 4

## 2023-05-20 ENCOUNTER — Ambulatory Visit
Admission: RE | Admit: 2023-05-20 | Discharge: 2023-05-20 | Disposition: A | Discharge status: Home / Self Care | Source: Ambulatory Visit | Attending: Radiation Oncology | Admitting: Radiation Oncology

## 2023-05-20 ENCOUNTER — Inpatient Hospital Stay

## 2023-05-20 ENCOUNTER — Other Ambulatory Visit: Payer: Self-pay

## 2023-05-20 DIAGNOSIS — D0511 Intraductal carcinoma in situ of right breast: Secondary | ICD-10-CM

## 2023-05-20 DIAGNOSIS — Z510 Encounter for antineoplastic radiation therapy: Secondary | ICD-10-CM | POA: Diagnosis not present

## 2023-05-20 LAB — CBC (CANCER CENTER ONLY)
HCT: 41 % (ref 36.0–46.0)
Hemoglobin: 13.9 g/dL (ref 12.0–15.0)
MCH: 34.8 pg — ABNORMAL HIGH (ref 26.0–34.0)
MCHC: 33.9 g/dL (ref 30.0–36.0)
MCV: 102.5 fL — ABNORMAL HIGH (ref 80.0–100.0)
Platelet Count: 255 10*3/uL (ref 150–400)
RBC: 4 MIL/uL (ref 3.87–5.11)
RDW: 14 % (ref 11.5–15.5)
WBC Count: 5.3 10*3/uL (ref 4.0–10.5)
nRBC: 0 % (ref 0.0–0.2)

## 2023-05-20 LAB — RAD ONC ARIA SESSION SUMMARY
Course Elapsed Days: 4
Plan Fractions Treated to Date: 5
Plan Prescribed Dose Per Fraction: 2.66 Gy
Plan Total Fractions Prescribed: 16
Plan Total Prescribed Dose: 42.56 Gy
Reference Point Dosage Given to Date: 13.3 Gy
Reference Point Session Dosage Given: 2.66 Gy
Session Number: 5

## 2023-05-23 ENCOUNTER — Ambulatory Visit
Admission: RE | Admit: 2023-05-23 | Discharge: 2023-05-23 | Disposition: A | Discharge status: Home / Self Care | Source: Ambulatory Visit | Attending: Radiation Oncology | Admitting: Radiation Oncology

## 2023-05-23 ENCOUNTER — Other Ambulatory Visit: Payer: Self-pay

## 2023-05-23 DIAGNOSIS — Z510 Encounter for antineoplastic radiation therapy: Secondary | ICD-10-CM | POA: Diagnosis not present

## 2023-05-23 LAB — RAD ONC ARIA SESSION SUMMARY
Course Elapsed Days: 7
Plan Fractions Treated to Date: 6
Plan Prescribed Dose Per Fraction: 2.66 Gy
Plan Total Fractions Prescribed: 16
Plan Total Prescribed Dose: 42.56 Gy
Reference Point Dosage Given to Date: 15.96 Gy
Reference Point Session Dosage Given: 2.66 Gy
Session Number: 6

## 2023-05-24 ENCOUNTER — Ambulatory Visit
Admission: RE | Admit: 2023-05-24 | Discharge: 2023-05-24 | Disposition: A | Discharge status: Home / Self Care | Source: Ambulatory Visit | Attending: Radiation Oncology | Admitting: Radiation Oncology

## 2023-05-24 ENCOUNTER — Other Ambulatory Visit: Payer: Self-pay

## 2023-05-24 DIAGNOSIS — Z510 Encounter for antineoplastic radiation therapy: Secondary | ICD-10-CM | POA: Diagnosis not present

## 2023-05-24 LAB — RAD ONC ARIA SESSION SUMMARY
Course Elapsed Days: 8
Plan Fractions Treated to Date: 7
Plan Prescribed Dose Per Fraction: 2.66 Gy
Plan Total Fractions Prescribed: 16
Plan Total Prescribed Dose: 42.56 Gy
Reference Point Dosage Given to Date: 18.62 Gy
Reference Point Session Dosage Given: 2.66 Gy
Session Number: 7

## 2023-05-25 ENCOUNTER — Ambulatory Visit
Admission: RE | Admit: 2023-05-25 | Discharge: 2023-05-25 | Disposition: A | Discharge status: Home / Self Care | Source: Ambulatory Visit | Attending: Radiation Oncology | Admitting: Radiation Oncology

## 2023-05-25 ENCOUNTER — Other Ambulatory Visit: Payer: Self-pay

## 2023-05-25 DIAGNOSIS — Z510 Encounter for antineoplastic radiation therapy: Secondary | ICD-10-CM | POA: Diagnosis not present

## 2023-05-25 LAB — RAD ONC ARIA SESSION SUMMARY
Course Elapsed Days: 9
Plan Fractions Treated to Date: 8
Plan Prescribed Dose Per Fraction: 2.66 Gy
Plan Total Fractions Prescribed: 16
Plan Total Prescribed Dose: 42.56 Gy
Reference Point Dosage Given to Date: 21.28 Gy
Reference Point Session Dosage Given: 2.66 Gy
Session Number: 8

## 2023-05-26 ENCOUNTER — Ambulatory Visit
Admission: RE | Admit: 2023-05-26 | Discharge: 2023-05-26 | Disposition: A | Discharge status: Home / Self Care | Source: Ambulatory Visit | Attending: Radiation Oncology | Admitting: Radiation Oncology

## 2023-05-26 ENCOUNTER — Other Ambulatory Visit: Payer: Self-pay

## 2023-05-26 DIAGNOSIS — Z510 Encounter for antineoplastic radiation therapy: Secondary | ICD-10-CM | POA: Diagnosis not present

## 2023-05-26 LAB — RAD ONC ARIA SESSION SUMMARY
Course Elapsed Days: 10
Plan Fractions Treated to Date: 9
Plan Prescribed Dose Per Fraction: 2.66 Gy
Plan Total Fractions Prescribed: 16
Plan Total Prescribed Dose: 42.56 Gy
Reference Point Dosage Given to Date: 23.94 Gy
Reference Point Session Dosage Given: 2.66 Gy
Session Number: 9

## 2023-05-27 ENCOUNTER — Other Ambulatory Visit: Payer: Self-pay

## 2023-05-27 ENCOUNTER — Ambulatory Visit
Admission: RE | Admit: 2023-05-27 | Discharge: 2023-05-27 | Disposition: A | Discharge status: Home / Self Care | Source: Ambulatory Visit | Attending: Radiation Oncology | Admitting: Radiation Oncology

## 2023-05-27 DIAGNOSIS — Z510 Encounter for antineoplastic radiation therapy: Secondary | ICD-10-CM | POA: Diagnosis not present

## 2023-05-27 LAB — RAD ONC ARIA SESSION SUMMARY
Course Elapsed Days: 11
Plan Fractions Treated to Date: 10
Plan Prescribed Dose Per Fraction: 2.66 Gy
Plan Total Fractions Prescribed: 16
Plan Total Prescribed Dose: 42.56 Gy
Reference Point Dosage Given to Date: 26.6 Gy
Reference Point Session Dosage Given: 2.66 Gy
Session Number: 10

## 2023-05-30 ENCOUNTER — Ambulatory Visit

## 2023-05-31 ENCOUNTER — Ambulatory Visit

## 2023-06-01 ENCOUNTER — Ambulatory Visit
Admission: RE | Admit: 2023-06-01 | Discharge: 2023-06-01 | Disposition: A | Discharge status: Home / Self Care | Source: Ambulatory Visit | Attending: Radiation Oncology | Admitting: Radiation Oncology

## 2023-06-01 ENCOUNTER — Other Ambulatory Visit: Payer: Self-pay

## 2023-06-01 ENCOUNTER — Inpatient Hospital Stay: Admitting: Occupational Therapy

## 2023-06-01 DIAGNOSIS — Z510 Encounter for antineoplastic radiation therapy: Secondary | ICD-10-CM | POA: Diagnosis not present

## 2023-06-01 LAB — RAD ONC ARIA SESSION SUMMARY
Course Elapsed Days: 16
Plan Fractions Treated to Date: 11
Plan Prescribed Dose Per Fraction: 2.66 Gy
Plan Total Fractions Prescribed: 16
Plan Total Prescribed Dose: 42.56 Gy
Reference Point Dosage Given to Date: 29.26 Gy
Reference Point Session Dosage Given: 2.66 Gy
Session Number: 11

## 2023-06-02 ENCOUNTER — Inpatient Hospital Stay

## 2023-06-02 ENCOUNTER — Ambulatory Visit
Admission: RE | Admit: 2023-06-02 | Discharge: 2023-06-02 | Disposition: A | Discharge status: Home / Self Care | Source: Ambulatory Visit | Attending: Radiation Oncology | Admitting: Radiation Oncology

## 2023-06-02 ENCOUNTER — Other Ambulatory Visit: Payer: Self-pay

## 2023-06-02 DIAGNOSIS — Z510 Encounter for antineoplastic radiation therapy: Secondary | ICD-10-CM | POA: Insufficient documentation

## 2023-06-02 DIAGNOSIS — D0511 Intraductal carcinoma in situ of right breast: Secondary | ICD-10-CM | POA: Insufficient documentation

## 2023-06-02 LAB — RAD ONC ARIA SESSION SUMMARY
Course Elapsed Days: 17
Plan Fractions Treated to Date: 12
Plan Prescribed Dose Per Fraction: 2.66 Gy
Plan Total Fractions Prescribed: 16
Plan Total Prescribed Dose: 42.56 Gy
Reference Point Dosage Given to Date: 31.92 Gy
Reference Point Session Dosage Given: 2.66 Gy
Session Number: 12

## 2023-06-03 ENCOUNTER — Other Ambulatory Visit: Payer: Self-pay

## 2023-06-03 ENCOUNTER — Ambulatory Visit
Admission: RE | Admit: 2023-06-03 | Discharge: 2023-06-03 | Disposition: A | Discharge status: Home / Self Care | Source: Ambulatory Visit | Attending: Radiation Oncology | Admitting: Radiation Oncology

## 2023-06-03 ENCOUNTER — Inpatient Hospital Stay

## 2023-06-03 DIAGNOSIS — D0511 Intraductal carcinoma in situ of right breast: Secondary | ICD-10-CM

## 2023-06-03 DIAGNOSIS — Z510 Encounter for antineoplastic radiation therapy: Secondary | ICD-10-CM | POA: Diagnosis not present

## 2023-06-03 LAB — RAD ONC ARIA SESSION SUMMARY
Course Elapsed Days: 18
Plan Fractions Treated to Date: 13
Plan Prescribed Dose Per Fraction: 2.66 Gy
Plan Total Fractions Prescribed: 16
Plan Total Prescribed Dose: 42.56 Gy
Reference Point Dosage Given to Date: 34.58 Gy
Reference Point Session Dosage Given: 2.66 Gy
Session Number: 13

## 2023-06-03 LAB — CBC (CANCER CENTER ONLY)
HCT: 41.8 % (ref 36.0–46.0)
Hemoglobin: 14.3 g/dL (ref 12.0–15.0)
MCH: 34.6 pg — ABNORMAL HIGH (ref 26.0–34.0)
MCHC: 34.2 g/dL (ref 30.0–36.0)
MCV: 101.2 fL — ABNORMAL HIGH (ref 80.0–100.0)
Platelet Count: 307 10*3/uL (ref 150–400)
RBC: 4.13 MIL/uL (ref 3.87–5.11)
RDW: 13.8 % (ref 11.5–15.5)
WBC Count: 5.3 10*3/uL (ref 4.0–10.5)
nRBC: 0 % (ref 0.0–0.2)

## 2023-06-06 ENCOUNTER — Ambulatory Visit
Admission: RE | Admit: 2023-06-06 | Discharge: 2023-06-06 | Disposition: A | Discharge status: Home / Self Care | Source: Ambulatory Visit | Attending: Radiation Oncology | Admitting: Radiation Oncology

## 2023-06-06 ENCOUNTER — Other Ambulatory Visit: Payer: Self-pay

## 2023-06-06 ENCOUNTER — Ambulatory Visit

## 2023-06-06 DIAGNOSIS — Z510 Encounter for antineoplastic radiation therapy: Secondary | ICD-10-CM | POA: Diagnosis not present

## 2023-06-06 LAB — RAD ONC ARIA SESSION SUMMARY
Course Elapsed Days: 21
Plan Fractions Treated to Date: 14
Plan Prescribed Dose Per Fraction: 2.66 Gy
Plan Total Fractions Prescribed: 16
Plan Total Prescribed Dose: 42.56 Gy
Reference Point Dosage Given to Date: 37.24 Gy
Reference Point Session Dosage Given: 2.66 Gy
Session Number: 14

## 2023-06-07 ENCOUNTER — Ambulatory Visit

## 2023-06-07 ENCOUNTER — Other Ambulatory Visit: Payer: Self-pay

## 2023-06-07 ENCOUNTER — Ambulatory Visit
Admission: RE | Admit: 2023-06-07 | Discharge: 2023-06-07 | Disposition: A | Discharge status: Home / Self Care | Source: Ambulatory Visit | Attending: Radiation Oncology | Admitting: Radiation Oncology

## 2023-06-07 ENCOUNTER — Other Ambulatory Visit: Payer: Self-pay | Admitting: *Deleted

## 2023-06-07 DIAGNOSIS — Z510 Encounter for antineoplastic radiation therapy: Secondary | ICD-10-CM | POA: Diagnosis not present

## 2023-06-07 LAB — RAD ONC ARIA SESSION SUMMARY
Course Elapsed Days: 22
Plan Fractions Treated to Date: 15
Plan Prescribed Dose Per Fraction: 2.66 Gy
Plan Total Fractions Prescribed: 16
Plan Total Prescribed Dose: 42.56 Gy
Reference Point Dosage Given to Date: 39.9 Gy
Reference Point Session Dosage Given: 2.66 Gy
Session Number: 15

## 2023-06-07 MED ORDER — SILVER SULFADIAZINE 1 % EX CREA: 1.0000 | TOPICAL_CREAM | Freq: Two times a day (BID) | CUTANEOUS | 0 refills | Status: AC

## 2023-06-08 ENCOUNTER — Other Ambulatory Visit: Payer: Self-pay

## 2023-06-08 ENCOUNTER — Ambulatory Visit
Admission: RE | Admit: 2023-06-08 | Discharge: 2023-06-08 | Disposition: A | Discharge status: Home / Self Care | Source: Ambulatory Visit | Attending: Radiation Oncology | Admitting: Radiation Oncology

## 2023-06-08 ENCOUNTER — Ambulatory Visit

## 2023-06-08 DIAGNOSIS — Z510 Encounter for antineoplastic radiation therapy: Secondary | ICD-10-CM | POA: Diagnosis not present

## 2023-06-08 LAB — RAD ONC ARIA SESSION SUMMARY
Course Elapsed Days: 23
Plan Fractions Treated to Date: 16
Plan Prescribed Dose Per Fraction: 2.66 Gy
Plan Total Fractions Prescribed: 16
Plan Total Prescribed Dose: 42.56 Gy
Reference Point Dosage Given to Date: 42.56 Gy
Reference Point Session Dosage Given: 2.66 Gy
Session Number: 16

## 2023-06-09 ENCOUNTER — Other Ambulatory Visit: Payer: Self-pay

## 2023-06-09 ENCOUNTER — Ambulatory Visit
Admission: RE | Admit: 2023-06-09 | Discharge: 2023-06-09 | Disposition: A | Discharge status: Home / Self Care | Source: Ambulatory Visit | Attending: Radiation Oncology | Admitting: Radiation Oncology

## 2023-06-09 DIAGNOSIS — Z510 Encounter for antineoplastic radiation therapy: Secondary | ICD-10-CM | POA: Diagnosis not present

## 2023-06-09 LAB — RAD ONC ARIA SESSION SUMMARY
Course Elapsed Days: 24
Plan Fractions Treated to Date: 1
Plan Prescribed Dose Per Fraction: 2 Gy
Plan Total Fractions Prescribed: 5
Plan Total Prescribed Dose: 10 Gy
Reference Point Dosage Given to Date: 2 Gy
Reference Point Session Dosage Given: 2 Gy
Session Number: 17

## 2023-06-10 ENCOUNTER — Ambulatory Visit
Admission: RE | Admit: 2023-06-10 | Discharge: 2023-06-10 | Disposition: A | Discharge status: Home / Self Care | Source: Ambulatory Visit | Attending: Radiation Oncology | Admitting: Radiation Oncology

## 2023-06-10 ENCOUNTER — Other Ambulatory Visit: Payer: Self-pay

## 2023-06-10 DIAGNOSIS — Z510 Encounter for antineoplastic radiation therapy: Secondary | ICD-10-CM | POA: Diagnosis not present

## 2023-06-10 LAB — RAD ONC ARIA SESSION SUMMARY
Course Elapsed Days: 25
Plan Fractions Treated to Date: 2
Plan Prescribed Dose Per Fraction: 2 Gy
Plan Total Fractions Prescribed: 5
Plan Total Prescribed Dose: 10 Gy
Reference Point Dosage Given to Date: 4 Gy
Reference Point Session Dosage Given: 2 Gy
Session Number: 18

## 2023-06-13 ENCOUNTER — Ambulatory Visit

## 2023-06-13 ENCOUNTER — Ambulatory Visit
Admission: RE | Admit: 2023-06-13 | Discharge: 2023-06-13 | Disposition: A | Discharge status: Home / Self Care | Source: Ambulatory Visit | Attending: Radiation Oncology | Admitting: Radiation Oncology

## 2023-06-13 ENCOUNTER — Other Ambulatory Visit: Payer: Self-pay

## 2023-06-13 DIAGNOSIS — Z510 Encounter for antineoplastic radiation therapy: Secondary | ICD-10-CM | POA: Diagnosis not present

## 2023-06-13 LAB — RAD ONC ARIA SESSION SUMMARY
Course Elapsed Days: 28
Plan Fractions Treated to Date: 3
Plan Prescribed Dose Per Fraction: 2 Gy
Plan Total Fractions Prescribed: 5
Plan Total Prescribed Dose: 10 Gy
Reference Point Dosage Given to Date: 6 Gy
Reference Point Session Dosage Given: 2 Gy
Session Number: 19

## 2023-06-14 ENCOUNTER — Other Ambulatory Visit: Payer: Self-pay

## 2023-06-14 ENCOUNTER — Ambulatory Visit
Admission: RE | Admit: 2023-06-14 | Discharge: 2023-06-14 | Disposition: A | Discharge status: Home / Self Care | Source: Ambulatory Visit | Attending: Radiation Oncology | Admitting: Radiation Oncology

## 2023-06-14 ENCOUNTER — Ambulatory Visit

## 2023-06-14 DIAGNOSIS — Z510 Encounter for antineoplastic radiation therapy: Secondary | ICD-10-CM | POA: Diagnosis not present

## 2023-06-14 LAB — RAD ONC ARIA SESSION SUMMARY
Course Elapsed Days: 29
Plan Fractions Treated to Date: 4
Plan Prescribed Dose Per Fraction: 2 Gy
Plan Total Fractions Prescribed: 5
Plan Total Prescribed Dose: 10 Gy
Reference Point Dosage Given to Date: 8 Gy
Reference Point Session Dosage Given: 2 Gy
Session Number: 20

## 2023-06-15 ENCOUNTER — Ambulatory Visit
Admission: RE | Admit: 2023-06-15 | Discharge: 2023-06-15 | Disposition: A | Discharge status: Home / Self Care | Source: Ambulatory Visit | Attending: Radiation Oncology | Admitting: Radiation Oncology

## 2023-06-15 ENCOUNTER — Encounter: Payer: Self-pay | Admitting: *Deleted

## 2023-06-15 ENCOUNTER — Other Ambulatory Visit: Payer: Self-pay

## 2023-06-15 DIAGNOSIS — Z510 Encounter for antineoplastic radiation therapy: Secondary | ICD-10-CM | POA: Diagnosis not present

## 2023-06-15 LAB — RAD ONC ARIA SESSION SUMMARY
Course Elapsed Days: 30
Plan Fractions Treated to Date: 5
Plan Prescribed Dose Per Fraction: 2 Gy
Plan Total Fractions Prescribed: 5
Plan Total Prescribed Dose: 10 Gy
Reference Point Dosage Given to Date: 10 Gy
Reference Point Session Dosage Given: 2 Gy
Session Number: 21

## 2023-06-16 ENCOUNTER — Encounter: Payer: Self-pay | Admitting: Plastic Surgery

## 2023-06-16 ENCOUNTER — Ambulatory Visit: Admitting: Plastic Surgery

## 2023-06-16 VITALS — BP 116/75 | HR 89 | Ht 59.0 in | Wt 170.0 lb

## 2023-06-16 DIAGNOSIS — L905 Scar conditions and fibrosis of skin: Secondary | ICD-10-CM | POA: Diagnosis not present

## 2023-06-16 DIAGNOSIS — Z923 Personal history of irradiation: Secondary | ICD-10-CM

## 2023-06-16 DIAGNOSIS — D0511 Intraductal carcinoma in situ of right breast: Secondary | ICD-10-CM

## 2023-06-16 DIAGNOSIS — Z9889 Other specified postprocedural states: Secondary | ICD-10-CM

## 2023-06-16 NOTE — Radiation Completion Notes (Signed)
 Patient Name: LYNESE, FISCHEL MRN: [id number] Date of Birth: 22-Sep-1970 Referring Physician: Seretha Dance, M.D. Date of Service: 2023-06-16 Radiation Oncologist: Glenis Langdon, M.D. [facility]                             RADIATION ONCOLOGY END OF TREATMENT NOTE     Diagnosis: D05.11 Intraductal carcinoma in situ of right breast Staging on 2023-05-03: Ductal carcinoma in situ (DCIS) of right breast T=pTis (DCIS), N=pN0, M=cM0 Staging on 2023-03-22: Ductal carcinoma in situ (DCIS) of right breast T=cTis (DCIS), N=cN0, M=cM0 Intent: Curative     HPI: Patient is a 53 year old female who presented with an abnormal mammogram of the right breast showing calcifications warranting further evaluation.  Breast MRI confirmed a 1.4 cm span of lung linear non-mass enhancement of the right breast in the upper outer quadrant.  This corresponded to area of biopsy-proven DCIS.  Initial targeted biopsy showed high-grade solid and cribriform ductal carcinoma in situ with necrosis present.  She underwent wide local excision and sentinel node biopsy showing again high-grade ductal carcinoma in situ comedonecrosis with calcifications.  Tumor spanned at least 5 mm.  Margins were clear at 7 mm.  4 sentinel lymph nodes were examined all negative for metastatic disease.      ==========DELIVERED PLANS==========  First Treatment Date: 2023-05-16 Last Treatment Date: 2023-06-15   Plan Name: Breast_R Site: Breast, Right Technique: 3D Mode: Photon Dose Per Fraction: 2.66 Gy Prescribed Dose (Delivered / Prescribed): 42.56 Gy / 42.56 Gy Prescribed Fxs (Delivered / Prescribed): 16 / 16   Plan Name: Breast_R_Bst Site: Breast, Right Technique: 3D Mode: Photon Dose Per Fraction: 2 Gy Prescribed Dose (Delivered / Prescribed): 10 Gy / 10 Gy Prescribed Fxs (Delivered / Prescribed): 5 / 5     ==========ON TREATMENT VISIT DATES========== 2023-05-17, 2023-05-24, 2023-06-01, 2023-06-07,  2023-06-14     ==========UPCOMING VISITS========== 09/05/2023 [facility] ON[facility] SOZO SCREEN Waverly Hageman, RN  07/11/2023 [facility] RAD ONCOLOGY FOLLOW UP 30 Glenis Langdon, MD  06/16/2023 [facility] SPE[facility] PLASTICS CONSULT [name], MD        ==========APPENDIX - ON TREATMENT VISIT NOTES==========   See weekly On Treatment Notes in Epic for details in the Media tab (listed as Progress notes on the On Treatment Visit Dates listed above).

## 2023-06-16 NOTE — Progress Notes (Signed)
 Referring Provider Jimmy Moulding, MD [address] [facility] [place],  [place] [address]   CC:  Chief Complaint  Patient presents with   Advice Only      Wendy Russell is an 53 y.o. female.  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  She subsequently underwent radiation therapy.  She completed her radiation yesterday and presents today for discussion of reconstruction.  Patient is unhappy with the appearance of her scar with a small contour defect under the scar she has discomfort around the excision site and she is unhappy with the asymmetry of her breast.  Allergies  Allergen Reactions   Codeine Anaphylaxis   Aspirin Other (See Comments)    Nose bleeds   Cat Dander Itching   Doxycycline Nausea And Vomiting   Tramadol     Tingling in face   Dog Epithelium (Canis Lupus Familiaris) Itching    Outpatient Encounter Medications as of 06/16/2023  Medication Sig   acetaminophen  (TYLENOL ) 500 MG tablet Take 500-1,000 mg by mouth every 6 (six) hours as needed (pain.).   azelastine (ASTELIN) 0.1 % nasal spray Place 1 spray into both nostrils 2 (two) times daily. Use in each nostril as directed   escitalopram (LEXAPRO) 10 MG tablet Take 10 mg by mouth at bedtime.   gabapentin  (NEURONTIN ) 300 MG capsule Take 300 mg by mouth 3 (three) times daily.   nortriptyline (PAMELOR) 10 MG capsule Take 10 mg by mouth at bedtime. nortriptyline 10 mg at night for one week then increase to 20 mg at night for headaches, migraines, and anxiety.   silver  sulfADIAZINE  (SILVADENE ) 1 % cream Apply 1 Application topically 2 (two) times daily.   No facility-administered encounter medications on file as of 06/16/2023.     Past Medical History:  Diagnosis Date   Anemia    H/O   Anxiety    Arthritis    HANDS   Claustrophobia    COVID 10/2019   monocloncal atb and got it 10/09/2019   Dizziness    Ductal carcinoma in situ  (DCIS) of right breast    Family history of adverse reaction to anesthesia    mom-delayed emergence    sister-unsure of reaction   GERD (gastroesophageal reflux disease)    OCC-TUMS PRN   Macrocytosis 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    Migraines    OSA on CPAP     Past Surgical History:  Procedure Laterality Date   BREAST BIOPSY Right 03/14/2023   Stereo bx, x-clip, DUCTAL CARCINOMA IN SITU, HIGH GRADE,   BREAST BIOPSY Right 03/14/2023   MM RT BREAST BX W LOC DEV 1ST LESION IMAGE BX SPEC STEREO GUIDE 03/14/2023 [facility]   BREAST BIOPSY Right 04/05/2023   MM RT RADIO FREQUENCY TAG LOC MAMMO GUIDE 04/05/2023 [facility]   CESAREAN SECTION     X2   DILITATION & CURRETTAGE/HYSTROSCOPY WITH NOVASURE ABLATION N/A 07/08/2017   Procedure: DILATATION & CURETTAGE/HYSTEROSCOPY WITH NOVASURE ABLATION;  Surgeon: Carolynn Citrin, MD;  Location: [facility];  Service: Gynecology;  Laterality: N/A;   HEMORRHOID SURGERY N/A 09/22/2018   Procedure: OPEN HEMORRHOIDECTOMY;  Surgeon: Conrado Delay, DO;  Location: [facility];  Service: General;  Laterality: N/A;   LAPAROSCOPIC BILATERAL SALPINGECTOMY Bilateral 11/22/2017   Procedure: LAPAROSCOPIC BILATERAL SALPINGECTOMY;  Surgeon: Carolynn Citrin, MD;  Location: [facility];  Service: Gynecology;  Laterality: Bilateral;   LAPAROSCOPIC SUPRACERVICAL HYSTERECTOMY N/A 11/22/2017   Procedure: LAPAROSCOPIC  SUPRACERVICAL HYSTERECTOMY;  Surgeon: Schermerhorn, Joselyn Nicely, MD;  Location: [facility];  Service: Gynecology;  Laterality: N/A;   PART MASTECTOMY,RADIO FREQUENCY LOCALIZER,AXILLARY SENTINEL NODE BIOPSY Right 04/15/2023   Procedure: PART MASTECTOMY,RADIO FREQUENCY LOCALIZER,AXILLARY SENTINEL NODE BIOPSY;  Surgeon: Conrado Delay, DO;  Location: [facility];  Service: General;  Laterality: Right;   TUBAL LIGATION  1993   WISDOM TOOTH EXTRACTION      Family History  Problem Relation Age of Onset   Breast cancer Mother 74    Social History   Social History  Narrative   Not on file     Review of Systems General: Denies fevers, chills, weight loss CV: Denies chest pain, shortness of breath, palpitations Breast: Scar from previous surgery, asymmetry, history of DCIS  Physical Exam    06/16/2023    3:02 PM 05/03/2023    1:01 PM 04/15/2023   11:16 AM  Vitals with BMI  Height 4\' 11"  4\' 11"    Weight 170 lbs 165 lbs   BMI 34.32 33.31   Systolic 116 129 161  Diastolic 75 68 74  Pulse 89 81 58    General:  No acute distress,  Alert and oriented, Non-Toxic, Normal speech and affect Breast: Patient has appropriately sized breast.  There is a slight asymmetry with the right side smaller than the left.  She has extensive radiation changes to the right breast and axilla.  There is a scar approximately 5 cm in length on the anterior surface of the breast just superior to the nipple. Mammogram: February 2025 BI-RADS 4 resulted in lumpectomy in the right breast Assessment/Plan Right breast DCIS: Patient has scar consistent with a lumpectomy and extensive radiation changes to the right breast.  She is interested in reconstruction.  We discussed in very general terms fat grafting to improve the contour of her breast and possibly increase the volume of her breast I do not see a significant difference and I think that getting better symmetry will be challenging at best.  I believe that she may derive some benefit from fat grafting to the breast.  She is not a candidate for any surgery at this time due to the recent nature of her radiation therapy.  I have strongly encouraged her to continue massaging the entire breast to moisturize and heal the skin.  To continue massaging the scar to improve the appearance.  I did discuss increasing foods in her diet that contain vitamin A vitamin C and zinc as well as watching her protein intake.  She will return to see me in 4 months at which time we will discuss the appropriate timing of a reconstructive procedure to achieve the  outcome that she is interested in.  Wendy Russell 06/16/2023, 4:49 PM

## 2023-07-11 ENCOUNTER — Encounter: Payer: Self-pay | Admitting: Radiation Oncology

## 2023-07-11 ENCOUNTER — Ambulatory Visit
Admission: RE | Admit: 2023-07-11 | Discharge: 2023-07-11 | Disposition: A | Discharge status: Home / Self Care | Source: Ambulatory Visit | Attending: Radiation Oncology | Admitting: Radiation Oncology

## 2023-07-11 VITALS — BP 125/72 | HR 71 | Temp 97.0°F | Resp 16 | Ht 59.0 in | Wt 168.4 lb

## 2023-07-11 DIAGNOSIS — D0511 Intraductal carcinoma in situ of right breast: Secondary | ICD-10-CM | POA: Diagnosis present

## 2023-07-11 NOTE — Progress Notes (Signed)
 Radiation Oncology Follow up Note  Name: Wendy Russell   Date:   07/11/2023 MRN:  [id number] DOB: Mar 21, 1970    This 53 y.o. 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  REFERRING PROVIDER: Jimmy Moulding, MD  HPI: Patient is a 53 year old female now out 1 month having completed whole breast radiation to her right breast for ER positive ductal carcinoma in situ seen today in routine follow-up she is doing well.  She specifically denies breast tenderness cough or bone pain..  She is not on endocrine therapy based on the ER negative nature of her disease.  COMPLICATIONS OF TREATMENT: none  FOLLOW UP COMPLIANCE: keeps appointments   PHYSICAL EXAM:  BP 125/72   Pulse 71   Temp (!) 97 F (36.1 C) (Tympanic)   Resp 16   Ht 4\' 11"  (1.499 m)   Wt 168 lb 6.4 oz (76.4 kg)   LMP 06/20/2017 Comment: upreg neg  BMI 34.01 kg/m  Lungs are clear to A&P cardiac examination essentially unremarkable with regular rate and rhythm. No dominant mass or nodularity is noted in either breast in 2 positions examined. Incision is well-healed. No axillary or supraclavicular adenopathy is appreciated. Cosmetic result is excellent.  Well-developed well-nourished patient in NAD. HEENT reveals PERLA, EOMI, discs not visualized.  Oral cavity is clear. No oral mucosal lesions are identified. Neck is clear without evidence of cervical or supraclavicular adenopathy. Lungs are clear to A&P. Cardiac examination is essentially unremarkable with regular rate and rhythm without murmur rub or thrill. Abdomen is benign with no organomegaly or masses noted. Motor sensory and DTR levels are equal and symmetric in the upper and lower extremities. Cranial nerves II through XII are grossly intact. Proprioception is intact. No peripheral adenopathy or edema is identified. No motor or sensory levels are noted. Crude visual  fields are within normal range.  RADIOLOGY RESULTS: No current films for review  PLAN: Present time patient is doing well 1 month out from whole breast radiation and pleased with her overall progress.  Of asked to see her back in 6 months for follow-up.  Patient knows to call with any concerns at any time.  I would like to take this opportunity to thank you for allowing me to participate in the care of your patient.Glenis Langdon, MD

## 2023-09-05 ENCOUNTER — Inpatient Hospital Stay: Attending: Oncology | Admitting: *Deleted

## 2023-09-05 DIAGNOSIS — D0511 Intraductal carcinoma in situ of right breast: Secondary | ICD-10-CM

## 2023-09-05 NOTE — Progress Notes (Signed)
 SUBJECTIVE: Pt returns for her 3 month L-Dex screen.    PAIN:  Are you having pain? Yes, some breast soreness x 1 week   SOZO SCREENING: Patient was assessed today using the SOZO machine to determine the lymphedema index score. This was compared to her baseline score. It was determined that she is within the recommended range when compared to her baseline and no further action is needed at this time. She will continue SOZO screenings. These are done every 3 months for 2 years post operatively followed by every 6 months for 2 years, and then annually.     L-DEX FLOWSHEETS                L-DEX LYMPHEDEMA SCREENING    Measurement Type Unilateral     L-DEX MEASUREMENT EXTREMITY Upper Extremity     POSITION  Standing     DOMINANT SIDE Right     At Risk Side Right     BASELINE SCORE (UNILATERAL) 6.4    L-DEX SCORE (UNILATERAL) 0.6    VALUE CHANGE (UNILAT) -5.8

## 2023-10-17 ENCOUNTER — Ambulatory Visit: Admitting: Plastic Surgery

## 2023-10-26 ENCOUNTER — Encounter: Payer: Self-pay | Admitting: Obstetrics and Gynecology

## 2023-10-26 ENCOUNTER — Other Ambulatory Visit (HOSPITAL_COMMUNITY)
Admission: RE | Admit: 2023-10-26 | Discharge: 2023-10-26 | Disposition: A | Discharge status: Home / Self Care | Source: Ambulatory Visit | Attending: Obstetrics and Gynecology | Admitting: Obstetrics and Gynecology

## 2023-10-26 ENCOUNTER — Ambulatory Visit: Admitting: Obstetrics and Gynecology

## 2023-10-26 VITALS — BP 129/75 | HR 63 | Ht 59.0 in | Wt 167.0 lb

## 2023-10-26 DIAGNOSIS — N941 Unspecified dyspareunia: Secondary | ICD-10-CM | POA: Insufficient documentation

## 2023-10-26 DIAGNOSIS — Z01419 Encounter for gynecological examination (general) (routine) without abnormal findings: Secondary | ICD-10-CM | POA: Diagnosis not present

## 2023-10-26 DIAGNOSIS — Z853 Personal history of malignant neoplasm of breast: Secondary | ICD-10-CM

## 2023-10-26 DIAGNOSIS — Z124 Encounter for screening for malignant neoplasm of cervix: Secondary | ICD-10-CM

## 2023-10-26 NOTE — Progress Notes (Signed)
 Obstetrics and Gynecology New Patient Evaluation  Appointment Date: 10/26/2023  OBGYN Clinic: [facility]  Primary Care Provider: Lenon Layman ORN  Referring Provider: Self  Chief Complaint:  Chief Complaint  Patient presents with   Gynecologic Exam  Pain with intercourse  History of Present Illness: Wendy Russell is a 53 y.o.  G2P2 (Patient's last menstrual period was 06/20/2017.), seen for the above chief complaint. Her past medical history is significant for 2019 l/s supracervical hyst/LOA/cysto and bilateral ovarian cystostomies and early 2025 right high grade DCIS (er/pr/her2neu negative) s/p lumpectomy and currently doing XRT.  Patient believes she went through menopause, via hot flashes and night sweat s/s, shortly after her 2019 hyst. She denies any bleeding or spotting since the hyst. Husband travelled a lot last year and this year they've tried intercourse twice and both times were uncomfortable. She denies any vaginal dryness, irritation, abnormal discharge.   Review of Systems: Pertinent items are noted in HPI.    Patient Active Problem List   Diagnosis Date Noted   Genetic testing 04/18/2023   Ductal carcinoma in situ (DCIS) of right breast 03/23/2023   Left lower quadrant pain 07/26/2018   Headache disorder 04/03/2018   Post-operative state 11/22/2017   Healthcare maintenance 06/17/2017   Migraine without aura and without status migrainosus, not intractable 12/08/2015   Anxiety, generalized 12/05/2015    Past Medical History:  Past Medical History:  Diagnosis Date   Anemia    H/O   Anxiety    Arthritis    HANDS   Claustrophobia    COVID 10/2019   monocloncal atb and got it 10/09/2019   Dizziness    Ductal carcinoma in situ (DCIS) of right breast    Family history of adverse reaction to anesthesia    mom-delayed emergence    sister-unsure of reaction   GERD (gastroesophageal reflux disease)    OCC-TUMS PRN    Macrocytosis 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    Migraines    OSA on CPAP    Past Surgical History:  Past Surgical History:  Procedure Laterality Date   BREAST BIOPSY Right 03/14/2023   Stereo bx, x-clip, DUCTAL CARCINOMA IN SITU, HIGH GRADE,   BREAST BIOPSY Right 03/14/2023   MM RT BREAST BX W LOC DEV 1ST LESION IMAGE BX SPEC STEREO GUIDE 03/14/2023 [facility]   BREAST BIOPSY Right 04/05/2023   MM RT RADIO FREQUENCY TAG LOC MAMMO GUIDE 04/05/2023 [facility]   CESAREAN SECTION     X2   DILITATION & CURRETTAGE/HYSTROSCOPY WITH NOVASURE ABLATION N/A 07/08/2017   Procedure: DILATATION & CURETTAGE/HYSTEROSCOPY WITH NOVASURE ABLATION;  Surgeon: Lovetta Debby PARAS, MD;  Location: [facility];  Service: Gynecology;  Laterality: N/A;   HEMORRHOID SURGERY N/A 09/22/2018   Procedure: OPEN HEMORRHOIDECTOMY;  Surgeon: Tye Millet, DO;  Location: [facility];  Service: General;  Laterality: N/A;   LAPAROSCOPIC BILATERAL SALPINGECTOMY Bilateral 11/22/2017   Procedure: LAPAROSCOPIC BILATERAL SALPINGECTOMY;  Surgeon: Lovetta Debby PARAS, MD;  Location: [facility];  Service: Gynecology;  Laterality: Bilateral;   LAPAROSCOPIC SUPRACERVICAL HYSTERECTOMY N/A 11/22/2017   Procedure: LAPAROSCOPIC SUPRACERVICAL HYSTERECTOMY;  Surgeon: Schermerhorn, Debby PARAS, MD;  Location: [facility];  Service: Gynecology;  Laterality: N/A;   PART MASTECTOMY,RADIO FREQUENCY LOCALIZER,AXILLARY SENTINEL NODE BIOPSY Right 04/15/2023   Procedure: PART MASTECTOMY,RADIO FREQUENCY LOCALIZER,AXILLARY SENTINEL NODE BIOPSY;  Surgeon: Tye Millet, DO;  Location: [facility];  Service: General;  Laterality: Right;   TUBAL LIGATION  1993   WISDOM TOOTH EXTRACTION     Past Obstetrical History:  OB History  Gravida Para Term Preterm AB Living  2     2  SAB IAB Ectopic Multiple Live Births          # Outcome Date GA Lbr Len/2nd Weight Sex Type Anes PTL Lv  2 Gravida           1 Gravida             Obstetric Comments  C-Sections   Past  Gynecological History: As per HPI.  Social History:  Social History   Socioeconomic History   Marital status: Married    Spouse name: Not on file   Number of children: Not on file   Years of education: Not on file   Highest education level: Not on file  Occupational History   Not on file  Tobacco Use   Smoking status: Former    Current packs/day: 0.50    Average packs/day: 0.5 packs/day for 23.0 years (11.5 ttl pk-yrs)    Types: Cigarettes   Smokeless tobacco: Never  Vaping Use   Vaping status: Every Day   Substances: Flavoring  Substance and Sexual Activity   Alcohol use: Yes    Comment: rare   Drug use: No   Sexual activity: Yes  Other Topics Concern   Not on file  Social History Narrative   Not on file   Social Drivers of Health   Financial Resource Strain: Medium Risk (07/15/2023)   Received from [facility]   Overall Financial Resource Strain (CARDIA)    Difficulty of Paying Living Expenses: Somewhat hard  Food Insecurity: No Food Insecurity (07/15/2023)   Received from [facility]   Hunger Vital Sign    Within the past 12 months, you worried that your food would run out before you got the money to buy more.: Never true    Within the past 12 months, the food you bought just didn't last and you didn't have money to get more.: Never true  Transportation Needs: No Transportation Needs (07/15/2023)   Received from [facility] Transportation    In the past 12 months, has lack of transportation kept you from medical appointments or from getting medications?: No    Lack of Transportation (Non-Medical): No  Physical Activity: Not on file  Stress: Not on file  Social Connections: Not on file  Intimate Partner Violence: Not At Risk (03/22/2023)   Humiliation, Afraid, Rape, and Kick questionnaire    Fear of Current or Ex-Partner: No    Emotionally Abused: No    Physically Abused: No    Sexually Abused: No    Family History:  Family History  Problem Relation Age of Onset   Breast cancer Mother 20    Medications Breda Bond. Hegner had no medications administered during this visit. Current Outpatient Medications  Medication Sig Dispense Refill   acetaminophen  (TYLENOL ) 500 MG tablet Take 500-1,000 mg by mouth every 6 (six) hours as needed (pain.).     azelastine (ASTELIN) 0.1 % nasal spray Place 1 spray into both nostrils 2 (two) times daily. Use in each nostril as directed     escitalopram (LEXAPRO) 10 MG tablet Take 10 mg by mouth at bedtime.     gabapentin  (NEURONTIN ) 300 MG capsule Take 300 mg by mouth 3 (three) times daily.     nortriptyline (PAMELOR) 10 MG capsule Take 10 mg by mouth at bedtime. nortriptyline 10 mg at night for one week then increase to  20 mg at night for headaches, migraines, and anxiety.     silver  sulfADIAZINE  (SILVADENE ) 1 % cream Apply 1 Application topically 2 (two) times daily. (Patient not taking: Reported on 07/11/2023) 50 g 0   No current facility-administered medications for this visit.   Allergies Codeine, Aspirin, Cat dander, Doxycycline, Tramadol, and Dog epithelium (canis lupus familiaris)  Physical Exam:  BP 129/75   Pulse 63   Ht 4' 11 (1.499 m)   Wt 167 lb (75.8 kg)   LMP 06/20/2017 Comment: upreg neg  BMI 33.73 kg/m  Body mass index is 33.73 kg/m. General appearance: Well nourished, well developed female in no acute distress.  Respiratory:  Normal respiratory effort Abdomen: soft, nttp, nd Neuro/Psych:  Normal mood and affect.  Skin:  Warm and dry.  Lymphatic:  No inguinal lymphadenopathy.   Cervical exam performed in the presence of a chaperone Pelvic exam: is not limited by body habitus EGBUS: within normal limits, mild to moderate atrophy Vagina: within normal limits and with no blood or discharge in the vault. Mild atrophy Cervix: normal appearing cervix without tenderness, discharge or lesions. Bimanual: negative  Laboratory:  none  Radiology: none  Assessment: patient stable  Plan:  1. Cervical cancer screening (Primary) D/w her still needs routine screening q3y, which she wasn't aware of - Cytology - PAP( [place]) - Cervicovaginal ancillary only( [place])  2. Dyspareunia in female Follow up swabs. Will OTC lubricants and I will contact her breast cancer doctors to see if vaginal estrogen would be okay for her, in case she decides on that in the future. Risk of estrogen d/w her - Cytology - PAP( [place]) - Cervicovaginal ancillary only( [place])  3. History of breast cancer See above.   No orders of the defined types were placed in this encounter.   RTC 1 year and PRN  No follow-ups on file.  Future Appointments  Date Time Provider Department Center  11/02/2023  1:00 PM Melanee Annah BROCKS, MD [facility] None  01/02/2024  9:30 AM Georgina Shasta POUR, RN [facility] None  01/11/2024 10:30 AM Lenn Aran, MD [facility] None    Bebe Izell Raddle MD Attending [facility])

## 2023-10-26 NOTE — Progress Notes (Signed)
 Patient presents for Annual.  B/P elevated pt notes being anxious will repeat.  Pt has had Hysterectomy 11/22/2017  Last pap: 05/31/2017 WNL  Mammogram: 02/2023 was Dx w/ Breast Cancer DCIS in Right Breast this year recently finished radiation 06/15/2023         Family Hx of Breast Cancer: Mother      CC: Pain w. Intercourse , denies any vaginally bleeding

## 2023-10-27 LAB — CERVICOVAGINAL ANCILLARY ONLY
Bacterial Vaginitis (gardnerella): NEGATIVE
Candida Glabrata: NEGATIVE
Candida Vaginitis: NEGATIVE
Chlamydia: NEGATIVE
Comment: NEGATIVE
Comment: NEGATIVE
Comment: NEGATIVE
Comment: NEGATIVE
Comment: NEGATIVE
Comment: NORMAL
Neisseria Gonorrhea: NEGATIVE
Trichomonas: NEGATIVE

## 2023-10-28 LAB — CYTOLOGY - PAP
Adequacy: ABSENT
Comment: NEGATIVE
Diagnosis: NEGATIVE
High risk HPV: NEGATIVE

## 2023-11-01 ENCOUNTER — Ambulatory Visit: Payer: Self-pay | Admitting: Obstetrics and Gynecology

## 2023-11-02 ENCOUNTER — Encounter: Payer: Self-pay | Admitting: Oncology

## 2023-11-02 ENCOUNTER — Inpatient Hospital Stay: Attending: Oncology | Admitting: Oncology

## 2023-11-02 VITALS — BP 115/72 | HR 67 | Temp 97.8°F | Resp 19 | Ht 59.0 in | Wt 160.7 lb

## 2023-11-02 DIAGNOSIS — D0511 Intraductal carcinoma in situ of right breast: Secondary | ICD-10-CM

## 2023-11-02 DIAGNOSIS — Z923 Personal history of irradiation: Secondary | ICD-10-CM | POA: Diagnosis not present

## 2023-11-02 DIAGNOSIS — Z08 Encounter for follow-up examination after completed treatment for malignant neoplasm: Secondary | ICD-10-CM | POA: Diagnosis not present

## 2023-11-02 DIAGNOSIS — Z86000 Personal history of in-situ neoplasm of breast: Secondary | ICD-10-CM | POA: Diagnosis not present

## 2023-11-02 DIAGNOSIS — Z9011 Acquired absence of right breast and nipple: Secondary | ICD-10-CM | POA: Insufficient documentation

## 2023-11-02 DIAGNOSIS — F17290 Nicotine dependence, other tobacco product, uncomplicated: Secondary | ICD-10-CM | POA: Insufficient documentation

## 2023-11-02 NOTE — Progress Notes (Signed)
 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  She is now being referred for new diagnosis of DCIS of the right breast.Patient had a screening mammogram in January 2025 which was followed by diagnostic mammogram which showed 10 mm group of calcifications in the right breast.  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  Bilateral breast MRI was recommended given family history and high-grade DCIS.  Patient has been seen by Dr. Tye and plan is for lumpectomy on 04/15/2023.   No personal history of breast cancer or breast biopsies.  Family history significant for breast cancer in her mother and maternal cousin.  Mother also had ovarian cancer.  History of stomach cancer in maternal aunt.  She is G2 P2.  Age at first birth 78.  Hysterectomy in 2019.  No use of any OCPs or hormone replacement therapy.   Patient had lumpectomy on 04/15/2023.  Final pathology showed high-grade DCIS comedo type with calcifications no evidence of invasive cancer noma.  Margins negative for DCIS at 7 mm.  4 sentinel lymph nodes negative for malignancy.  ER testing was repeated on final biopsy specimen as well and was negative.  Genetic testing was negative as well    Interval history-patient is still  having some pain and warmth in her right breast since radiation.  She has some ongoing fatigue  ECOG PS- 0 Pain scale- 3   Review of systems- Review of Systems  Constitutional:  Positive for malaise/fatigue. Negative for chills, fever and weight loss.  HENT:  Negative for congestion, ear discharge and nosebleeds.   Eyes:  Negative for blurred vision.  Respiratory:  Negative for cough, hemoptysis, sputum production, shortness of breath and wheezing.   Cardiovascular:  Negative for chest pain, palpitations, orthopnea and claudication.  Gastrointestinal:  Negative for abdominal pain, blood in stool, constipation, diarrhea, heartburn, melena, nausea and vomiting.  Genitourinary:  Negative for dysuria, flank pain, frequency, hematuria and urgency.  Musculoskeletal:  Negative for back pain, joint pain and myalgias.  Skin:  Negative for rash.  Neurological:  Negative for dizziness, tingling, focal weakness, seizures, weakness and headaches.  Endo/Heme/Allergies:  Does not bruise/bleed easily.  Psychiatric/Behavioral:  Negative for depression and suicidal ideas. The patient does not have insomnia.       Allergies  Allergen Reactions   Codeine Anaphylaxis   Aspirin Other (See Comments)    Nose bleeds   Cat Dander Itching   Doxycycline Nausea And Vomiting   Tramadol     Tingling in face   Dog Epithelium (Canis Lupus Familiaris) Itching     Past Medical History:  Diagnosis Date   Anemia    H/O   Anxiety    Arthritis    HANDS   Claustrophobia    COVID 10/2019   monocloncal atb and got it 10/09/2019   Dizziness  Ductal carcinoma in situ (DCIS) of right breast    Family history of adverse reaction to anesthesia    mom-delayed emergence    sister-unsure of reaction   GERD (gastroesophageal reflux disease)    OCC-TUMS PRN   Macrocytosis 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    Migraines    OSA on CPAP      Past Surgical History:  Procedure Laterality Date   BREAST BIOPSY Right 03/14/2023    Stereo bx, x-clip, DUCTAL CARCINOMA IN SITU, HIGH GRADE,   BREAST BIOPSY Right 03/14/2023   MM RT BREAST BX W LOC DEV 1ST LESION IMAGE BX SPEC STEREO GUIDE 03/14/2023 [facility]   BREAST BIOPSY Right 04/05/2023   MM RT RADIO FREQUENCY TAG LOC MAMMO GUIDE 04/05/2023 [facility]   CESAREAN SECTION     X2   DILITATION & CURRETTAGE/HYSTROSCOPY WITH NOVASURE ABLATION N/A 07/08/2017   Procedure: DILATATION & CURETTAGE/HYSTEROSCOPY WITH NOVASURE ABLATION;  Surgeon: Lovetta Debby PARAS, MD;  Location: [facility];  Service: Gynecology;  Laterality: N/A;   HEMORRHOID SURGERY N/A 09/22/2018   Procedure: OPEN HEMORRHOIDECTOMY;  Surgeon: Tye Millet, DO;  Location: [facility];  Service: General;  Laterality: N/A;   LAPAROSCOPIC BILATERAL SALPINGECTOMY Bilateral 11/22/2017   Procedure: LAPAROSCOPIC BILATERAL SALPINGECTOMY;  Surgeon: Lovetta Debby PARAS, MD;  Location: [facility];  Service: Gynecology;  Laterality: Bilateral;   LAPAROSCOPIC SUPRACERVICAL HYSTERECTOMY N/A 11/22/2017   Procedure: LAPAROSCOPIC SUPRACERVICAL HYSTERECTOMY;  Surgeon: Schermerhorn, Debby PARAS, MD;  Location: [facility];  Service: Gynecology;  Laterality: N/A;   PART MASTECTOMY,RADIO FREQUENCY LOCALIZER,AXILLARY SENTINEL NODE BIOPSY Right 04/15/2023   Procedure: PART MASTECTOMY,RADIO FREQUENCY LOCALIZER,AXILLARY SENTINEL NODE BIOPSY;  Surgeon: Tye Millet, DO;  Location: [facility];  Service: General;  Laterality: Right;   TUBAL LIGATION  1993   WISDOM TOOTH EXTRACTION      Social History   Socioeconomic History   Marital status: Married    Spouse name: Not on file   Number of children: Not on file   Years of education: Not on file   Highest education level: Not on file  Occupational History   Not on file  Tobacco Use   Smoking status: Former    Current packs/day: 0.50    Average packs/day: 0.5 packs/day for 23.0 years (11.5 ttl pk-yrs)    Types: Cigarettes   Smokeless tobacco: Never  Vaping Use   Vaping status:  Every Day   Substances: Flavoring  Substance and Sexual Activity   Alcohol use: Yes    Comment: rare   Drug use: No   Sexual activity: Yes  Other Topics Concern   Not on file  Social History Narrative   Not on file   Social Drivers of Health   Financial Resource Strain: Medium Risk (07/15/2023)   Received from [facility]   Overall Financial Resource Strain (CARDIA)    Difficulty of Paying Living Expenses: Somewhat hard  Food Insecurity: No Food Insecurity (07/15/2023)   Received from [facility]   Hunger Vital Sign    Within the past 12 months, you worried that your food would run out before you got the money to buy more.: Never true    Within the past 12 months, the food you bought just didn't last and you didn't have money to get more.: Never true  Transportation Needs: No Transportation Needs (07/15/2023)   Received from [facility]    In the past 12 months, has lack of transportation kept you from medical appointments or  from getting medications?: No    Lack of Transportation (Non-Medical): No  Physical Activity: Not on file  Stress: Not on file  Social Connections: Not on file  Intimate Partner Violence: Not At Risk (03/22/2023)   Humiliation, Afraid, Rape, and Kick questionnaire    Fear of Current or Ex-Partner: No    Emotionally Abused: No    Physically Abused: No    Sexually Abused: No    Family History  Problem Relation Age of Onset   Breast cancer Mother 86     Current Outpatient Medications:    acetaminophen  (TYLENOL ) 500 MG tablet, Take 500-1,000 mg by mouth every 6 (six) hours as needed (pain.)., Disp: , Rfl:    azelastine (ASTELIN) 0.1 % nasal spray, Place 1 spray into both nostrils 2 (two) times daily. Use in each nostril as directed, Disp: , Rfl:    escitalopram (LEXAPRO) 10 MG tablet, Take 10 mg by mouth at bedtime., Disp: , Rfl:    gabapentin  (NEURONTIN ) 300 MG capsule,  Take 300 mg by mouth 3 (three) times daily., Disp: , Rfl:    nortriptyline (PAMELOR) 10 MG capsule, Take 10 mg by mouth at bedtime. nortriptyline 10 mg at night for one week then increase to 20 mg at night for headaches, migraines, and anxiety., Disp: , Rfl:    silver  sulfADIAZINE  (SILVADENE ) 1 % cream, Apply 1 Application topically 2 (two) times daily. (Patient not taking: Reported on 07/11/2023), Disp: 50 g, Rfl: 0  Physical exam: There were no vitals filed for this visit. Physical Exam Cardiovascular:     Rate and Rhythm: Normal rate and regular rhythm.     Heart sounds: Normal heart sounds.  Pulmonary:     Effort: Pulmonary effort is normal.     Breath sounds: Normal breath sounds.  Skin:    General: Skin is warm and dry.  Neurological:     Mental Status: She is alert and oriented to person, place, and time.    Breast exam was performed in seated and lying down position. Patient is status post right lumpectomy with a well-healed surgical scar. No evidence of any palpable masses.  There is mild diffuse redness and local warmth over the right breast likely secondary to underlying lymphedema and recent radiation no evidence of axillary adenopathy. No evidence of any palpable masses or lumps in the left breast. No evidence of leftt axillary adenopathy   I have personally reviewed labs listed below:    Latest Ref Rng & Units 04/08/2023   12:56 PM  CMP  Glucose 70 - 99 mg/dL 94   BUN 6 - 20 mg/dL 12   Creatinine 9.55 - 1.00 mg/dL 9.31   Sodium 864 - 854 mmol/L 138   Potassium 3.5 - 5.1 mmol/L 3.8   Chloride 98 - 111 mmol/L 102   CO2 22 - 32 mmol/L 27   Calcium 8.9 - 10.3 mg/dL 9.5       Latest Ref Rng & Units 06/03/2023    8:50 AM  CBC  WBC 4.0 - 10.5 K/uL 5.3   Hemoglobin 12.0 - 15.0 g/dL 85.6   Hematocrit 63.9 - 46.0 % 41.8   Platelets 150 - 400 K/uL 307     Assessment and plan- Patient is a 52 y.o. 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  She will be due  for a mammogram in January 2026 which we will schedule.  I will see her back in 6 months no labs.  She has mild local warmth in her right breast likely secondary to her radiation treatment and possible underlying lymphedema.  She does follow-up with Deland Clement from occupational therapy.  From an oncology  standpoint it would be okay for patient to use vaginal estrogen for dyspareunia if deemed necessary by GYN.  She had ER-negative DCIS and systemic absorption of vaginal estrogen is low   Visit Diagnosis 1. Encounter for follow-up surveillance of ductal carcinoma in situ (DCIS) of breast      Dr. Annah Skene, MD, MPH [facility] [phone] 11/02/2023 1:09 PM

## 2023-11-02 NOTE — Addendum Note (Signed)
 Addended by: Marialuisa Basara on: 11/02/2023 04:03 PM   Modules accepted: Orders

## 2023-11-02 NOTE — Progress Notes (Signed)
 Survivorship Care Plan visit completed.  Treatment summary reviewed and given to patient.  ASCO answers booklet reviewed and given to patient.  CARE program and Cancer Transitions discussed with patient along with other resources cancer center offers to patients and caregivers.  Patient verbalized understanding.

## 2023-11-02 NOTE — Progress Notes (Signed)
 Patient states she is still having some bad pain in the right breast and would like it looked at today.

## 2023-11-04 ENCOUNTER — Encounter: Payer: Self-pay | Admitting: Obstetrics and Gynecology

## 2023-11-08 ENCOUNTER — Other Ambulatory Visit: Payer: Self-pay | Admitting: Oncology

## 2023-11-08 DIAGNOSIS — Z08 Encounter for follow-up examination after completed treatment for malignant neoplasm: Secondary | ICD-10-CM

## 2023-12-05 ENCOUNTER — Telehealth: Payer: Self-pay | Admitting: Obstetrics and Gynecology

## 2023-12-05 NOTE — Telephone Encounter (Signed)
 Pt is calling in stating that she thought that Dr. Izell was going to prescribe her estrogen after he spoke with her Oncologist.  She was wanting to check the status of that.

## 2023-12-07 ENCOUNTER — Other Ambulatory Visit: Payer: Self-pay | Admitting: Obstetrics and Gynecology

## 2023-12-07 MED ORDER — ESTRADIOL 0.01 % VA CREA: TOPICAL_CREAM | VAGINAL | 2 refills | Status: AC

## 2024-01-02 ENCOUNTER — Inpatient Hospital Stay: Attending: Oncology | Admitting: *Deleted

## 2024-01-02 DIAGNOSIS — Z9189 Other specified personal risk factors, not elsewhere classified: Secondary | ICD-10-CM

## 2024-01-02 DIAGNOSIS — D0511 Intraductal carcinoma in situ of right breast: Secondary | ICD-10-CM | POA: Insufficient documentation

## 2024-01-02 DIAGNOSIS — I890 Lymphedema, not elsewhere classified: Secondary | ICD-10-CM | POA: Insufficient documentation

## 2024-01-02 NOTE — Progress Notes (Signed)
 SUBJECTIVE: Pt returns for her 3 month L-Dex screen.    PAIN:  Are you having pain? No   SOZO SCREENING: Patient was assessed today using the SOZO machine to determine the lymphedema index score. This was compared to her baseline score. It was determined that she is within the recommended range when compared to her baseline and no further action is needed at this time. She will continue SOZO screenings. These are done every 3 months for 2 years post operatively followed by every 6 months for 2 years, and then annually.     L-DEX FLOWSHEETS                L-DEX LYMPHEDEMA SCREENING    Measurement Type Unilateral     L-DEX MEASUREMENT EXTREMITY Upper Extremity     POSITION  Standing     DOMINANT SIDE Right     At Risk Side Right    BASELINE SCORE (UNILATERAL) 6.4    L-DEX SCORE (UNILATERAL) 3.6    VALUE CHANGE (UNILAT) -2.8

## 2024-01-09 ENCOUNTER — Other Ambulatory Visit: Payer: Self-pay | Admitting: Radiation Oncology

## 2024-01-11 ENCOUNTER — Encounter: Payer: Self-pay | Admitting: Radiation Oncology

## 2024-01-11 ENCOUNTER — Ambulatory Visit
Admission: RE | Admit: 2024-01-11 | Discharge: 2024-01-11 | Disposition: A | Discharge status: Home / Self Care | Source: Ambulatory Visit | Attending: Radiation Oncology | Admitting: Radiation Oncology

## 2024-01-11 VITALS — BP 132/68 | HR 67 | Temp 97.9°F | Resp 16 | Wt 159.2 lb

## 2024-01-11 DIAGNOSIS — C50911 Malignant neoplasm of unspecified site of right female breast: Secondary | ICD-10-CM

## 2024-01-11 NOTE — Progress Notes (Signed)
 Radiation Oncology Follow up Note  Name: Wendy Russell   Date:   01/11/2024 MRN:  [id number] DOB: [birth year]    This 53 y.o. female presents to the clinic today for 51-month follow-up status post whole breast radiation to her right breast for ER-negative ductal carcinoma in situ.  REFERRING PROVIDER: Lenon Layman ORN, MD  HPI: Patient is a 53 year old female now at 7 months having pleated whole breast radiation to her right breast for ER-negative ductal carcinoma in situ.  Seen today in routine follow-up she is doing well..  She specifically denies breast tenderness cough or bone pain.  She has mammogram scheduled for January.  Based on the negative ER status she is not on endocrine therapy.  COMPLICATIONS OF TREATMENT: none  FOLLOW UP COMPLIANCE: keeps appointments   PHYSICAL EXAM:  BP 132/68   Pulse 67   Temp 97.9 F (36.6 C)   Resp 16   Wt 159 lb 3.2 oz (72.2 kg)   LMP 06/20/2017 Comment: upreg neg  SpO2 97%   BMI 32.15 kg/m  Lungs are clear to A&P cardiac examination essentially unremarkable with regular rate and rhythm. No dominant mass or nodularity is noted in either breast in 2 positions examined. Incision is well-healed. No axillary or supraclavicular adenopathy is appreciated. Cosmetic result is excellent.  Well-developed well-nourished patient in NAD. HEENT reveals PERLA, EOMI, discs not visualized.  Oral cavity is clear. No oral mucosal lesions are identified. Neck is clear without evidence of cervical or supraclavicular adenopathy. Lungs are clear to A&P. Cardiac examination is essentially unremarkable with regular rate and rhythm without murmur rub or thrill. Abdomen is benign with no organomegaly or masses noted. Motor sensory and DTR levels are equal and symmetric in the upper and lower extremities. Cranial nerves II through XII are grossly intact. Proprioception is intact. No peripheral adenopathy or edema is identified. No motor or sensory levels are noted. Crude  visual fields are within normal range.  RADIOLOGY RESULTS: No current films for review  PLAN: Present time patient is now about 7 months doing well with no evidence of disease.  I am pleased with her overall progress.  I will see her back in 6 months for follow-up and then start once a year follow-up visits.  Patient knows to call with any concerns.  I would like to take this opportunity to thank you for allowing me to participate in the care of your patient.SABRA Marcey Penton, MD

## 2024-03-02 ENCOUNTER — Ambulatory Visit
Admission: RE | Admit: 2024-03-02 | Discharge: 2024-03-02 | Disposition: A | Source: Ambulatory Visit | Attending: Oncology

## 2024-03-02 DIAGNOSIS — Z86000 Personal history of in-situ neoplasm of breast: Secondary | ICD-10-CM | POA: Insufficient documentation

## 2024-03-02 DIAGNOSIS — Z08 Encounter for follow-up examination after completed treatment for malignant neoplasm: Secondary | ICD-10-CM

## 2024-03-02 HISTORY — DX: Personal history of irradiation: Z92.3

## 2024-04-02 ENCOUNTER — Inpatient Hospital Stay: Admitting: *Deleted

## 2024-05-02 ENCOUNTER — Ambulatory Visit: Admitting: Oncology

## 2024-07-11 ENCOUNTER — Ambulatory Visit: Admitting: Radiation Oncology
